# Patient Record
Sex: Male | Born: 1960 | Race: White | Hispanic: No | Smoking: Former smoker
Health system: Southern US, Community
[De-identification: ages and names within clinical notes are randomized; demographics above are authoritative.]

## PROBLEM LIST (undated history)

## (undated) DIAGNOSIS — K635 Polyp of colon: Secondary | ICD-10-CM

## (undated) DIAGNOSIS — E785 Hyperlipidemia, unspecified: Secondary | ICD-10-CM

## (undated) DIAGNOSIS — K760 Fatty (change of) liver, not elsewhere classified: Secondary | ICD-10-CM

## (undated) DIAGNOSIS — E559 Vitamin D deficiency, unspecified: Secondary | ICD-10-CM

## (undated) DIAGNOSIS — J309 Allergic rhinitis, unspecified: Secondary | ICD-10-CM

## (undated) DIAGNOSIS — E119 Type 2 diabetes mellitus without complications: Secondary | ICD-10-CM

## (undated) DIAGNOSIS — I10 Essential (primary) hypertension: Secondary | ICD-10-CM

## (undated) DIAGNOSIS — G629 Polyneuropathy, unspecified: Secondary | ICD-10-CM

## (undated) DIAGNOSIS — M549 Dorsalgia, unspecified: Secondary | ICD-10-CM

## (undated) DIAGNOSIS — K5792 Diverticulitis of intestine, part unspecified, without perforation or abscess without bleeding: Secondary | ICD-10-CM

## (undated) DIAGNOSIS — G4730 Sleep apnea, unspecified: Secondary | ICD-10-CM

## (undated) DIAGNOSIS — K219 Gastro-esophageal reflux disease without esophagitis: Secondary | ICD-10-CM

## (undated) HISTORY — DX: Dorsalgia, unspecified: M54.9

## (undated) HISTORY — PX: ESOPHAGOGASTRODUODENOSCOPY: SHX1529

## (undated) HISTORY — DX: Allergic rhinitis, unspecified: J30.9

## (undated) HISTORY — DX: Diverticulitis of intestine, part unspecified, without perforation or abscess without bleeding: K57.92

## (undated) HISTORY — PX: BACK SURGERY: SHX140

## (undated) HISTORY — DX: Fatty (change of) liver, not elsewhere classified: K76.0

## (undated) HISTORY — DX: Essential (primary) hypertension: I10

## (undated) HISTORY — DX: Sleep apnea, unspecified: G47.30

## (undated) HISTORY — DX: Hyperlipidemia, unspecified: E78.5

## (undated) HISTORY — DX: Vitamin D deficiency, unspecified: E55.9

## (undated) HISTORY — DX: Type 2 diabetes mellitus without complications: E11.9

## (undated) HISTORY — PX: ABDOMINAL HERNIA REPAIR: SHX539

## (undated) HISTORY — DX: Gastro-esophageal reflux disease without esophagitis: K21.9

## (undated) HISTORY — DX: Polyneuropathy, unspecified: G62.9

## (undated) HISTORY — PX: COLONOSCOPY: SHX174

## (undated) HISTORY — DX: Polyp of colon: K63.5

---

## 2005-10-30 HISTORY — PX: TOTAL HIP ARTHROPLASTY: SHX124

## 2021-04-14 ENCOUNTER — Other Ambulatory Visit: Payer: Self-pay

## 2021-04-14 ENCOUNTER — Encounter: Payer: Self-pay | Admitting: Pulmonary Disease

## 2021-04-14 ENCOUNTER — Ambulatory Visit (INDEPENDENT_AMBULATORY_CARE_PROVIDER_SITE_OTHER): Payer: Medicare Other | Admitting: Pulmonary Disease

## 2021-04-14 ENCOUNTER — Other Ambulatory Visit (HOSPITAL_COMMUNITY)
Admission: RE | Admit: 2021-04-14 | Discharge: 2021-04-14 | Disposition: A | Discharge status: Home / Self Care | Payer: Medicare Other | Source: Ambulatory Visit | Attending: Pulmonary Disease | Admitting: Pulmonary Disease

## 2021-04-14 VITALS — BP 110/62 | HR 65 | Temp 97.1°F | Ht 72.0 in | Wt 208.6 lb

## 2021-04-14 DIAGNOSIS — J449 Chronic obstructive pulmonary disease, unspecified: Secondary | ICD-10-CM | POA: Diagnosis present

## 2021-04-14 DIAGNOSIS — G4733 Obstructive sleep apnea (adult) (pediatric): Secondary | ICD-10-CM | POA: Diagnosis not present

## 2021-04-14 DIAGNOSIS — J9611 Chronic respiratory failure with hypoxia: Secondary | ICD-10-CM | POA: Diagnosis not present

## 2021-04-14 LAB — COMPREHENSIVE METABOLIC PANEL
ALT: 23 U/L (ref 0–44)
AST: 18 U/L (ref 15–41)
Albumin: 3.9 g/dL (ref 3.5–5.0)
Alkaline Phosphatase: 70 U/L (ref 38–126)
Anion gap: 7 (ref 5–15)
BUN: 12 mg/dL (ref 6–20)
CO2: 29 mmol/L (ref 22–32)
Calcium: 9.4 mg/dL (ref 8.9–10.3)
Chloride: 101 mmol/L (ref 98–111)
Creatinine, Ser: 0.76 mg/dL (ref 0.61–1.24)
GFR, Estimated: >60 mL/min (ref >60)
Glucose, Bld: 113 mg/dL — ABNORMAL HIGH (ref 70–99)
Potassium: 3.7 mmol/L (ref 3.5–5.1)
Sodium: 137 mmol/L (ref 135–145)
Total Bilirubin: 0.7 mg/dL (ref 0.3–1.2)
Total Protein: 7.2 g/dL (ref 6.5–8.1)

## 2021-04-14 LAB — CBC WITH DIFFERENTIAL/PLATELET
Abs Immature Granulocytes: 0.03 10*3/uL (ref 0.00–0.07)
Basophils Absolute: 0.1 10*3/uL (ref 0.0–0.1)
Basophils Relative: 1 %
Eosinophils Absolute: 0.1 10*3/uL (ref 0.0–0.5)
Eosinophils Relative: 1 %
HCT: 47.4 % (ref 39.0–52.0)
Hemoglobin: 15.6 g/dL (ref 13.0–17.0)
Immature Granulocytes: 0 %
Lymphocytes Relative: 25 %
Lymphs Abs: 2.6 10*3/uL (ref 0.7–4.0)
MCH: 30.1 pg (ref 26.0–34.0)
MCHC: 32.9 g/dL (ref 30.0–36.0)
MCV: 91.3 fL (ref 80.0–100.0)
Monocytes Absolute: 1.2 10*3/uL — ABNORMAL HIGH (ref 0.1–1.0)
Monocytes Relative: 12 %
Neutro Abs: 6.4 10*3/uL (ref 1.7–7.7)
Neutrophils Relative %: 61 %
Platelets: 331 10*3/uL (ref 150–400)
RBC: 5.19 MIL/uL (ref 4.22–5.81)
RDW: 13.8 % (ref 11.5–15.5)
WBC: 10.4 10*3/uL (ref 4.0–10.5)
nRBC: 0 % (ref 0.0–0.2)

## 2021-04-14 NOTE — Progress Notes (Signed)
[facility]  Chief Complaint  Patient presents with   Consult    Changing doctors from [place]--Currently CPAP & O2 HS    Constitutional:  BP 110/62 (BP Location: Left Arm, Cuff Size: Normal)   Pulse 65   Temp (!) 97.1 F (36.2 C) (Temporal)   Ht 6' (1.829 m)   Wt 208 lb 9.6 oz (94.6 kg)   SpO2 96% Comment: Room air  BMI 28.29 kg/m   Past Medical History:  Diverticulitis, Colon polyp, Fatty liver, GERD, Back pain, Gastroparesis, HTN, DM type 2, Neuropathy, Vit D deficiency  Past Surgical History:  He  has a past surgical history that includes Esophagogastroduodenoscopy; Colonoscopy; Back surgery; Total hip arthroplasty (Bilateral, 2007); and Abdominal hernia repair.  Brief Summary:  Jerry Glenn is a 60 y.o. male former smoker with COPD, obstructive sleep apnea, and chronic hypoxic respiratory failure.      Subjective:   He has been followed by [facility].  He has COPD, sleep apnea, mild bronchiectasis, lung nodule, and hypoxia.  He presents today for second opinion and transfer pulmonary/sleep care.    He is from [place].  Was in the [employer].  Disabled due to back issues.  Used to work at [employer].  Had pneumonia years ago.  No animal/bird exposures.  No history of TB.  His mother had emphysema.  He isn't sure if he was every checked for alpha 1 antitrypsin.  He keeps up with activity at steady pace.  He uses 2 liters oxygen sometimes with activity.  He is using CPAP with 2 liters oxygen at night.  No issues with mask fit.  Sleeping well.  Has occasional cough with clear sputum.  No wheeze, chest pain, hemoptysis, fever, weight loss, or leg swelling.  Occasional gets cramps.  Has trouble with allergies in Spring and Fall.    Physical Exam:   Appearance - well kempt   ENMT - no sinus tenderness, no oral exudate, no LAN, Mallampati 3 airway, no stridor  Respiratory - decreased breath sounds bilaterally, no  wheezing or rales  CV - s1s2 regular rate and rhythm, no murmurs  Ext - no clubbing, no edema  Skin - no rashes  Psych - normal mood and affect   Pulmonary testing:  PFT 10/12/17 >> FEV1 1.14 (32%), FEV1% 54, TLC 4.58 (64%), DLCO 30%  Chest Imaging:  LDCT chest 06/25/17 >> 2 mm nodule RLLL, mild cylindrical BTX, scar lingula/RML/RUL  Sleep Tests:  PSG 05/16/11 >> AHI 27.8, SpO2 low 75% CPAP titration 10/13/17 >> CPAP 11 cm H2O with 2 liters O2 CPAP 03/15/21 to 04/13/21 >> used on 30 of 30 nights with average 7 hrs 4 min.  Average AHI 0.2 with CPAP 11 cm H2O  Social History:  He  reports that he quit smoking about 2 years ago. His smoking use included cigarettes. He has a 40.00 pack-year smoking history. He has never used smokeless tobacco.  Family History:  His family history includes COPD in his mother; Cancer in an other family member.     Assessment/Plan:   COPD with emphysema and chronic bronchitis. - will arrange for repeat PFT and check of alpha 1 antitrypsin level - continue trelegy and prn albuterol  Seasonal allergic rhinitis. - continue singulair  Obstructive sleep apnea. - he is compliant with CPAP and reports benefit from therapy - continue CPAP 11 cm H2O with 2 liters oxygen at night  Regional bronchiectasis. - likely from prior respiratory infections -  bronchial hygiene  Lung nodule, history of tobacco abuse. - will arrange for referral to K[facility]ice Robinsons to have him enrolled in lung cancer screening program again  Chronic respiratory failure with hypoxia. - goal SpO2 > 90% - 2 liters oxygen at night with CPAP and prn during he day with exertion  Time Spent Involved in Patient Care on Day of Examination:  48 minutes  Follow up:   Patient Instructions  Lab tests today  Will arrange for pulmonary function test at [facility]  Will arrange for referral to [facility]ice Robinsons to talk about enrolling in lung cancer screening program  Follow up in 3  months  Medication List:   Allergies as of 04/14/2021   No Known Allergies      Medication List        Accurate as of April 14, 2021 10:42 AM. If you have any questions, ask your nurse or doctor.          albuterol 108 (90 Base) MCG/ACT inhaler Commonly known as: VENTOLIN HFA Inhale 2 puffs into the lungs every 6 (six) hours as needed for wheezing or shortness of breath.   amLODipine 5 MG tablet Commonly known as: NORVASC Take 5 mg by mouth daily.   aspirin 81 MG chewable tablet Chew 81 mg by mouth daily.   Farxiga 10 MG Tabs tablet Generic drug: dapagliflozin propanediol Take 10 mg by mouth daily.   Fish Oil 1000 MG Caps Take by mouth in the morning and at bedtime.   fluticasone 50 MCG/ACT nasal spray Commonly known as: FLONASE Place 1 spray into both nostrils daily.   furosemide 20 MG tablet Commonly known as: LASIX Take 20 mg by mouth daily as needed.   gabapentin 300 MG capsule Commonly known as: NEURONTIN Take 300 mg by mouth 2 (two) times daily.   HYDROcodone-acetaminophen 10-325 MG tablet Commonly known as: NORCO Take 1 tablet by mouth 3 (three) times daily as needed.   ipratropium 0.02 % nebulizer solution Commonly known as: ATROVENT Take 0.5 mg by nebulization every 6 (six) hours as needed for wheezing or shortness of breath.   lisinopril-hydrochlorothiazide 20-12.5 MG tablet Commonly known as: ZESTORETIC Take 2 tablets by mouth daily.   montelukast 10 MG tablet Commonly known as: SINGULAIR Take 10 mg by mouth at bedtime.   multivitamin tablet Take 1 tablet by mouth daily.   naproxen sodium 220 MG tablet Commonly known as: ALEVE Take 220 mg by mouth daily as needed.   ondansetron 4 MG tablet Commonly known as: ZOFRAN Take 4 mg by mouth 2 (two) times daily as needed for nausea or vomiting.   potassium chloride 10 MEQ tablet Commonly known as: KLOR-CON Take 10 mEq by mouth daily as needed.   RYBELSUS PO Take by mouth daily.    simvastatin 20 MG tablet Commonly known as: ZOCOR Take 20 mg by mouth daily.   Trelegy Ellipta 100-62.5-25 MCG/INH Aepb Generic drug: Fluticasone-Umeclidin-Vilant Inhale 1 puff into the lungs daily.   zolpidem 10 MG tablet Commonly known as: AMBIEN Take 10 mg by mouth at bedtime as needed for sleep.        Signature:  Coralyn Helling, MD [facility]/Critical Care Pager - [phone] 04/14/2021, 10:42 AM

## 2021-04-14 NOTE — Patient Instructions (Signed)
Lab tests today  Will arrange for pulmonary function test at [facility]  Will arrange for referral to [facility] Robinsons to talk about enrolling in lung cancer screening program  Follow up in 3 months

## 2021-04-15 ENCOUNTER — Telehealth: Payer: Self-pay | Admitting: Pulmonary Disease

## 2021-04-15 LAB — ALPHA-1-ANTITRYPSIN: A-1 Antitrypsin, Ser: 154 mg/dL (ref 101–187)

## 2021-04-15 NOTE — Telephone Encounter (Signed)
I do not see record of our office attempting to contact patient.

## 2021-04-15 NOTE — Telephone Encounter (Signed)
Called and spoke with patient who stated someone from the phone number [phone] called this morning but patient was unable to answer at the time and no voicemail was left per patient. When writer called the number it was to the Pulmonary division [facility] office. Patient unsure the reason for phone call unless it was for the lung cancer screening referral placed by Dr Craige Cotta yesterday (04/14/21). Patient states he is ok and nothing is needed at this time and he was just returning the call from the number listed. Will route to [facility] triage to see if someone from that office called.

## 2021-04-18 ENCOUNTER — Institutional Professional Consult (permissible substitution): Payer: Self-pay | Admitting: Internal Medicine

## 2021-04-18 ENCOUNTER — Other Ambulatory Visit: Payer: Self-pay | Admitting: *Deleted

## 2021-04-18 DIAGNOSIS — Z87891 Personal history of nicotine dependence: Secondary | ICD-10-CM

## 2021-05-25 ENCOUNTER — Other Ambulatory Visit: Payer: Self-pay

## 2021-05-25 ENCOUNTER — Ambulatory Visit (HOSPITAL_COMMUNITY)
Admission: RE | Admit: 2021-05-25 | Discharge: 2021-05-25 | Disposition: A | Discharge status: Home / Self Care | Payer: Medicare Other | Source: Ambulatory Visit | Attending: Acute Care | Admitting: Acute Care

## 2021-05-25 ENCOUNTER — Ambulatory Visit (INDEPENDENT_AMBULATORY_CARE_PROVIDER_SITE_OTHER): Payer: Medicare Other | Admitting: Acute Care

## 2021-05-25 ENCOUNTER — Encounter: Payer: Self-pay | Admitting: Acute Care

## 2021-05-25 DIAGNOSIS — Z87891 Personal history of nicotine dependence: Secondary | ICD-10-CM | POA: Diagnosis present

## 2021-05-25 NOTE — Progress Notes (Signed)
Virtual Visit via Telephone Note  I connected with Jerry Glenn on 05/25/21 at 12:00 PM EDT by telephone and verified that I am speaking with the correct person using two identifiers.  Location: Patient: At home Provider: [address] [place], [place], [address]   Pt. Has agreed to telephone visit. He understands the limitations of virtual visits, and is willing to proceed.    Shared Decision Making Visit Lung Cancer Screening Program [phone])   Eligibility: Age 60 y.o. Pack Years Smoking History Calculation 41 pack year smoking history (# packs/per year x # years smoked) Recent History of coughing up blood  no Unexplained weight loss? no ( >Than 15 pounds within the last 6 months ) Prior History Lung / other cancer no (Diagnosis within the last 5 years already requiring surveillance chest CT Scans). Smoking Status Former Smoker Former Smokers: Years since quit: 2 years  Quit Date: 2020  Visit Components: Discussion included one or more decision making aids. yes Discussion included risk/benefits of screening. yes Discussion included potential follow up diagnostic testing for abnormal scans. yes Discussion included meaning and risk of over diagnosis. yes Discussion included meaning and risk of False Positives. yes Discussion included meaning of total radiation exposure. yes  Counseling Included: Importance of adherence to annual lung cancer LDCT screening. yes Impact of comorbidities on ability to participate in the program. yes Ability and willingness to under diagnostic treatment. yes  Smoking Cessation Counseling: Current Smokers:  Discussed importance of smoking cessation. yes Information about tobacco cessation classes and interventions provided to patient. yes Patient provided with "ticket" for LDCT Scan. yes Symptomatic Patient. no  Counseling Diagnosis Code: Tobacco Use Z72.0 Asymptomatic Patient yes  Counseling (Intermediate counseling: > three minutes  counseling) N4627 Former Smokers:  Discussed the importance of maintaining cigarette abstinence. yes Diagnosis Code: Personal History of Nicotine Dependence. O35.009 Information about tobacco cessation classes and interventions provided to patient. Yes Patient provided with "ticket" for LDCT Scan. yes Written Order for Lung Cancer Screening with LDCT placed in Epic. Yes (CT Chest Lung Cancer Screening Low Dose W/O CM) [id number] Z12.2-Screening of respiratory organs Z87.891-Personal history of nicotine dependence  I spent 25 minutes of face to face time with Jerry Glenn discussing the risks and benefits of lung cancer screening. We viewed a power point together that explained in detail the above noted topics. We took the time to pause the power point at intervals to allow for questions to be asked and answered to ensure understanding. We discussed that he had taken the single most powerful action possible to decrease his risk of developing lung cancer when he quit smoking. I counseled him to remain smoke free, and to contact me if he ever had the desire to smoke again so that I can provide resources and tools to help support the effort to remain smoke free. We discussed the time and location of the scan, and that either  Abigail Miyamoto RN or I will call with the results within  24-48 hours of receiving them. He has my card and contact information in the event he needs to speak with me, in addition to a copy of the power point we reviewed as a resource. He verbalized understanding of all of the above and had no further questions upon leaving the office.     I explained to the patient that there has been a high incidence of coronary artery disease noted on these exams. I explained that this is a non-gated exam therefore degree or  severity cannot be determined. This patient is on statin therapy. I have asked the patient to follow-up with their PCP regarding any incidental finding of coronary artery disease  and management with diet or medication as they feel is clinically indicated. The patient verbalized understanding of the above and had no further questions.     Bevelyn Ngo, NP 05/25/2021

## 2021-05-25 NOTE — Patient Instructions (Signed)
Thank you for participating in the [facility] Screening Program. It was our pleasure to meet you today. We will call you with the results of your scan within the next few days. Your scan will be assigned a Lung RADS category score by the physicians reading the scans.  This Lung RADS score determines follow up scanning.  See below for description of categories, and follow up screening recommendations. We will be in touch to schedule your follow up screening annually or based on recommendations of our providers. We will fax a copy of your scan results to your Primary Care Physician, or the physician who referred you to the program, to ensure they have the results. Please call the office if you have any questions or concerns regarding your scanning experience or results.  Our office number is [phone]. Please speak with Denise Phelps, RN. She is our Lung Cancer Screening RN. If she is unavailable when you call, please have the office staff send her a message. She will return your call at her earliest convenience. Remember, if your scan is normal, we will scan you annually as long as you continue to meet the criteria for the program. (Age 55-77, Current smoker or smoker who has quit within the last 15 years). If you are a smoker, remember, quitting is the single most powerful action that you can take to decrease your risk of lung cancer and other pulmonary, breathing related problems. We know quitting is hard, and we are here to help.  Please let us know if there is anything we can do to help you meet your goal of quitting. If you are a former smoker, congratulations. We are proud of you! Remain smoke free! Remember you can refer friends or family members through the number above.  We will screen them to make sure they meet criteria for the program. Thank you for helping us take better care of you by participating in Lung Screening.  Lung RADS Categories:  Lung RADS 1: no nodules  or definitely non-concerning nodules.  Recommendation is for a repeat annual scan in 12 months.  Lung RADS 2:  nodules that are non-concerning in appearance and behavior with a very low likelihood of becoming an active cancer. Recommendation is for a repeat annual scan in 12 months.  Lung RADS 3: nodules that are probably non-concerning , includes nodules with a low likelihood of becoming an active cancer.  Recommendation is for a 6-month repeat screening scan. Often noted after an upper respiratory illness. We will be in touch to make sure you have no questions, and to schedule your 6-month scan.  Lung RADS 4 A: nodules with concerning findings, recommendation is most often for a follow up scan in 3 months or additional testing based on our provider's assessment of the scan. We will be in touch to make sure you have no questions and to schedule the recommended 3 month follow up scan.  Lung RADS 4 B:  indicates findings that are concerning. We will be in touch with you to schedule additional diagnostic testing based on our provider's  assessment of the scan.   

## 2021-06-09 NOTE — Progress Notes (Signed)
I have called the patient with the results of his low dose CT. I explained that his scan was read as a lung RADS 3.  He states that when he had the scan done he was having a COPD flare, and feels this may be related to that.  I explained that to be cautious we will recheck this area on a repeat CT scan in January 2023.  This is the 55-month follow-up CT recommended by radiology. I also explained that there was notation of aortic atherosclerosis in addition to left main and three-vessel coronary artery disease.  The patient is currently on statin therapy, and he is followed by cardiology once annually.  We discussed that he does have a family history, his father passed away at the age of 3 or 57 of a massive heart attack. He will follow-up with cardiology. Denise please fax results to PCP, let them know we will follow-up with a 35-month CT in January 2023, and please place order for 63-month follow-up low-dose CT. Thanks so much

## 2021-06-10 ENCOUNTER — Telehealth: Payer: Self-pay | Admitting: Acute Care

## 2021-06-10 NOTE — Telephone Encounter (Signed)
Bevelyn Ngo, NP  Valeda Malm A; [facility] Triage Pool 26 minutes ago (10:54 AM)   Please have patient sign a release and have CT  copied onto CD for patient to pick up. Thanks so much     Called and spoke with pt stating to him to go to [facility] where he had the CT performed and they should be able to give him a disc of the scan. Stated to him that he might have to fill out a release and he verbalized understanding. Nothing further needed.

## 2021-06-10 NOTE — Telephone Encounter (Signed)
Pt is wanting to know if he can get a copy of his CT results possibly on a disk so that he can have record of it to give to his cardiologist. Pls regard; [phone].

## 2021-06-13 ENCOUNTER — Other Ambulatory Visit: Payer: Self-pay | Admitting: *Deleted

## 2021-06-13 DIAGNOSIS — Z87891 Personal history of nicotine dependence: Secondary | ICD-10-CM

## 2021-06-24 ENCOUNTER — Ambulatory Visit (HOSPITAL_COMMUNITY)
Admission: RE | Admit: 2021-06-24 | Discharge: 2021-06-24 | Disposition: A | Discharge status: Home / Self Care | Payer: Medicare Other | Source: Ambulatory Visit | Attending: Pulmonary Disease | Admitting: Pulmonary Disease

## 2021-06-24 ENCOUNTER — Other Ambulatory Visit: Payer: Self-pay

## 2021-06-24 DIAGNOSIS — J449 Chronic obstructive pulmonary disease, unspecified: Secondary | ICD-10-CM | POA: Insufficient documentation

## 2021-06-24 LAB — PULMONARY FUNCTION TEST
DL/VA % pred: 62 %
DL/VA: 2.64 ml/min/mmHg/L
DLCO unc % pred: 43 %
DLCO unc: 13 ml/min/mmHg
FEF 25-75 Post: 0.43 L/sec
FEF 25-75 Pre: 0.32 L/sec
FEF2575-%Change-Post: 34 %
FEF2575-%Pred-Post: 13 %
FEF2575-%Pred-Pre: 9 %
FEV1-%Change-Post: 12 %
FEV1-%Pred-Post: 29 %
FEV1-%Pred-Pre: 26 %
FEV1-Post: 1.15 L
FEV1-Pre: 1.02 L
FEV1FVC-%Change-Post: 9 %
FEV1FVC-%Pred-Pre: 49 %
FEV6-%Change-Post: 6 %
FEV6-%Pred-Post: 48 %
FEV6-%Pred-Pre: 45 %
FEV6-Post: 2.39 L
FEV6-Pre: 2.24 L
FEV6FVC-%Change-Post: 3 %
FEV6FVC-%Pred-Post: 89 %
FEV6FVC-%Pred-Pre: 86 %
FVC-%Change-Post: 3 %
FVC-%Pred-Post: 54 %
FVC-%Pred-Pre: 53 %
FVC-Post: 2.81 L
FVC-Pre: 2.73 L
Post FEV1/FVC ratio: 41 %
Post FEV6/FVC ratio: 85 %
Pre FEV1/FVC ratio: 37 %
Pre FEV6/FVC Ratio: 82 %
RV % pred: 189 %
RV: 4.42 L
TLC % pred: 100 %
TLC: 7.45 L

## 2021-06-24 MED ORDER — ALBUTEROL SULFATE (2.5 MG/3ML) 0.083% IN NEBU
2.5000 mg | INHALATION_SOLUTION | Freq: Once | RESPIRATORY_TRACT | Status: AC
  Administered 2021-06-24: 2.5 mg via RESPIRATORY_TRACT

## 2021-07-15 ENCOUNTER — Encounter: Payer: Self-pay | Admitting: Pulmonary Disease

## 2021-07-15 ENCOUNTER — Other Ambulatory Visit: Payer: Self-pay

## 2021-07-15 ENCOUNTER — Ambulatory Visit (INDEPENDENT_AMBULATORY_CARE_PROVIDER_SITE_OTHER): Payer: Medicare Other | Admitting: Pulmonary Disease

## 2021-07-15 VITALS — BP 112/58 | HR 89 | Temp 98.5°F | Ht 72.0 in | Wt 203.0 lb

## 2021-07-15 DIAGNOSIS — G4733 Obstructive sleep apnea (adult) (pediatric): Secondary | ICD-10-CM | POA: Diagnosis not present

## 2021-07-15 DIAGNOSIS — J9611 Chronic respiratory failure with hypoxia: Secondary | ICD-10-CM | POA: Diagnosis not present

## 2021-07-15 DIAGNOSIS — J449 Chronic obstructive pulmonary disease, unspecified: Secondary | ICD-10-CM | POA: Diagnosis not present

## 2021-07-15 MED ORDER — DOXYCYCLINE HYCLATE 100 MG PO TABS: 100.0000 mg | ORAL_TABLET | Freq: Two times a day (BID) | ORAL | 0 refills | Status: DC

## 2021-07-15 MED ORDER — PREDNISONE 10 MG PO TABS: ORAL_TABLET | ORAL | 0 refills | Status: AC

## 2021-07-15 MED ORDER — BREZTRI AEROSPHERE 160-9-4.8 MCG/ACT IN AERO: 2.0000 | INHALATION_SPRAY | Freq: Two times a day (BID) | RESPIRATORY_TRACT | 0 refills | Status: DC

## 2021-07-15 NOTE — Patient Instructions (Signed)
Try sample of breztri two puffs in the morning and two puffs in the evening, and rinse your mouth after each use.  Call after sample is complete if you would like refills for this.  Don't use trelegy while using breztri.  Follow up in 4 months.

## 2021-07-15 NOTE — Addendum Note (Signed)
Addended by: Carleene Mains D on: 07/15/2021 10:33 AM   Modules accepted: Orders

## 2021-07-15 NOTE — Progress Notes (Signed)
[facility]  Chief Complaint  Patient presents with   Follow-up    Coughing up mucus yellow and green for a couple of weeks no SOB to report     Constitutional:  BP (!) 112/58 (BP Location: Left Arm, Patient Position: Sitting)   Pulse 89   Temp 98.5 F (36.9 C) (Temporal)   Ht 6' (1.829 m)   Wt 203 lb (92.1 kg)   SpO2 95%   BMI 27.53 kg/m   Past Medical History:  Diverticulitis, Colon polyp, Fatty liver, GERD, Back pain, Gastroparesis, HTN, DM type 2, Neuropathy, Vit D deficiency  Past Surgical History:  He  has a past surgical history that includes Esophagogastroduodenoscopy; Colonoscopy; Back surgery; Total hip arthroplasty (Bilateral, 2007); and Abdominal hernia repair.  Brief Summary:  Jerry Glenn is a 60 y.o. male former smoker with COPD, obstructive sleep apnea, and chronic hypoxic respiratory failure.      Subjective:   PFT showed severe obstruction, air trapping, moderate diffusion defect, and positive bronchodilator response.  LDCT showed lung nodule in RUL.  A1AT level normal.  He feels his cough and congestion are coming on again with weather change.  He usually needs prednisone and ABx when this happens.    Singulair has helped.  Wants to try breztri to see if this works better than trelegy.  Uses albuterol few times per week.  Uses CPAP nightly with oxygen.  Uses oxygen intermittently during the day.  He has pulse ox meter.  He plans to get flu and COVID vaccines in October.   Physical Exam:   Appearance - well kempt   ENMT - no sinus tenderness, no oral exudate, no LAN, Mallampati 3 airway, no stridor  Respiratory - decreased breath sounds bilaterally, no wheezing or rales  CV - s1s2 regular rate and rhythm, no murmurs  Ext - no clubbing, no edema  Skin - no rashes  Psych - normal mood and affect    Pulmonary testing:  PFT 10/12/17 >> FEV1 1.14 (32%), FEV1% 54, TLC 4.58 (64%), DLCO 30% A1AT  04/14/21 >> 154 PFT 06/24/21 >> FEV1 1.15 (29%), FEV1% 41, TLC 7.45 (100%), RV 4.42 (189%), DLCO 43%, +BD  Chest Imaging:  LDCT chest 06/25/17 >> 2 mm nodule RLLL, mild cylindrical BTX, scar lingula/RML/RUL LDCT chest 05/27/21 >> nodular area of architectural distortion in the medial aspect of the RUL 7.9 mm, bronchial wall thickening, mild centrilobular and paraseptal emphysema  Sleep Tests:  PSG 05/16/11 >> AHI 27.8, SpO2 low 75% CPAP titration 10/13/17 >> CPAP 11 cm H2O with 2 liters O2 CPAP 06/14/21 to 07/13/21 >> used on 30 of 30 nights with average 5 hrs 50 min.  Average AHI 0.1 with CPAP 11 cm H2O  Social History:  He  reports that he quit smoking about 2 years ago. His smoking use included cigarettes. He has a 40.00 pack-year smoking history. He has never used smokeless tobacco.  Family History:  His family history includes COPD in his mother; Cancer in an other family member.     Assessment/Plan:   COPD with emphysema and chronic bronchitis. - sample of breztri given; he will call once sample done to let us know if he would like to continue breztri or switch back to trelegy - prn albuterol - script for prednisone and doxycycline provided for him to keep on hand >> he is to call to update office he feels he needs to use these  Seasonal allergic rhinitis. - continue  singulair  Obstructive sleep apnea. - he is compliant with CPAP and reports benefit from therapy - he uses [facility] for his DME - continue CPAP 11 cm H2O with 2 liters oxygen at night  Regional bronchiectasis. - likely from prior respiratory infections - bronchial hygiene  Lung nodule, history of tobacco abuse. - he has f/u LDCT chest scheduled for January 2023  Chronic respiratory failure with hypoxia. - goal SpO2 > 90% - 2 liters oxygen at night with CPAP and prn during he day with exertion  Time Spent Involved in Patient Care on Day of Examination:  35 minutes  Follow up:   Patient Instructions  Try  sample of breztri two puffs in the morning and two puffs in the evening, and rinse your mouth after each use.  Call after sample is complete if you would like refills for this.  Don't use trelegy while using breztri.  Follow up in 4 months.  Medication List:   Allergies as of 07/15/2021   No Known Allergies      Medication List        Accurate as of July 15, 2021 10:14 AM. If you have any questions, ask your nurse or doctor.          STOP taking these medications    amLODipine 5 MG tablet Commonly known as: NORVASC Stopped by: Coralyn Helling, MD       TAKE these medications    albuterol 108 (90 Base) MCG/ACT inhaler Commonly known as: VENTOLIN HFA Inhale 2 puffs into the lungs every 6 (six) hours as needed for wheezing or shortness of breath.   aspirin 81 MG chewable tablet Chew 81 mg by mouth daily.   dapagliflozin propanediol 10 MG Tabs tablet Commonly known as: FARXIGA Take 10 mg by mouth daily.   doxycycline 100 MG tablet Commonly known as: VIBRA-TABS Take 1 tablet (100 mg total) by mouth 2 (two) times daily. Started by: Coralyn Helling, MD   Fish Oil 1000 MG Caps Take by mouth in the morning and at bedtime.   fluticasone 50 MCG/ACT nasal spray Commonly known as: FLONASE Place 1 spray into both nostrils daily.   furosemide 20 MG tablet Commonly known as: LASIX Take 20 mg by mouth daily as needed.   gabapentin 300 MG capsule Commonly known as: NEURONTIN Take 300 mg by mouth 2 (two) times daily.   HYDROcodone-acetaminophen 10-325 MG tablet Commonly known as: NORCO Take 1 tablet by mouth 3 (three) times daily as needed.   ipratropium 0.02 % nebulizer solution Commonly known as: ATROVENT Take 0.5 mg by nebulization every 6 (six) hours as needed for wheezing or shortness of breath.   lisinopril-hydrochlorothiazide 20-12.5 MG tablet Commonly known as: ZESTORETIC Take 2 tablets by mouth daily.   loratadine 10 MG tablet Commonly known as:  CLARITIN Take 10 mg by mouth daily.   montelukast 10 MG tablet Commonly known as: SINGULAIR Take 10 mg by mouth at bedtime.   multivitamin tablet Take 1 tablet by mouth daily.   naproxen sodium 220 MG tablet Commonly known as: ALEVE Take 220 mg by mouth daily as needed.   ondansetron 4 MG tablet Commonly known as: ZOFRAN Take 4 mg by mouth 2 (two) times daily as needed for nausea or vomiting.   potassium chloride 10 MEQ tablet Commonly known as: KLOR-CON Take 10 mEq by mouth daily as needed.   predniSONE 10 MG tablet Commonly known as: DELTASONE Take 3 tablets (30 mg total) by mouth daily with breakfast for  2 days, THEN 2 tablets (20 mg total) daily with breakfast for 2 days, THEN 1 tablet (10 mg total) daily with breakfast for 2 days. Start taking on: July 15, 2021 Started by: Coralyn Helling, MD   RYBELSUS PO Take by mouth daily.   simvastatin 20 MG tablet Commonly known as: ZOCOR Take 20 mg by mouth daily.   Trelegy Ellipta 100-62.5-25 MCG/INH Aepb Generic drug: Fluticasone-Umeclidin-Vilant Inhale 1 puff into the lungs daily.   zolpidem 10 MG tablet Commonly known as: AMBIEN Take 10 mg by mouth at bedtime as needed for sleep.        Signature:  Coralyn Helling, MD [facility] Pulmonary/Critical Care Pager - [phone] 07/15/2021, 10:14 AM

## 2021-07-28 ENCOUNTER — Other Ambulatory Visit: Payer: Self-pay

## 2021-07-28 ENCOUNTER — Telehealth: Payer: Self-pay | Admitting: Pulmonary Disease

## 2021-07-28 MED ORDER — BREZTRI AEROSPHERE 160-9-4.8 MCG/ACT IN AERO
2.0000 | INHALATION_SPRAY | Freq: Two times a day (BID) | RESPIRATORY_TRACT | 11 refills | Status: DC
Start: 2021-07-28 — End: 2022-07-04

## 2021-07-28 NOTE — Telephone Encounter (Signed)
Breztri sent to [facility] on [address]. Called patient and notified him. Nothing further needed at this time.

## 2021-11-22 ENCOUNTER — Ambulatory Visit: Payer: Medicare Other | Admitting: Pulmonary Disease

## 2021-12-20 ENCOUNTER — Other Ambulatory Visit: Payer: Self-pay

## 2021-12-20 ENCOUNTER — Encounter: Payer: Self-pay | Admitting: Pulmonary Disease

## 2021-12-20 ENCOUNTER — Ambulatory Visit (INDEPENDENT_AMBULATORY_CARE_PROVIDER_SITE_OTHER): Payer: 59 | Admitting: Pulmonary Disease

## 2021-12-20 ENCOUNTER — Telehealth: Payer: Self-pay | Admitting: Acute Care

## 2021-12-20 VITALS — BP 106/62 | HR 71 | Temp 98.1°F | Ht 72.0 in | Wt 195.8 lb

## 2021-12-20 DIAGNOSIS — J449 Chronic obstructive pulmonary disease, unspecified: Secondary | ICD-10-CM

## 2021-12-20 DIAGNOSIS — J9611 Chronic respiratory failure with hypoxia: Secondary | ICD-10-CM | POA: Diagnosis not present

## 2021-12-20 DIAGNOSIS — G4733 Obstructive sleep apnea (adult) (pediatric): Secondary | ICD-10-CM

## 2021-12-20 NOTE — Patient Instructions (Signed)
Will make sure your lung cancer screening CT chest will be set up in [facility]  Follow up in 6 months

## 2021-12-20 NOTE — Telephone Encounter (Signed)
Left message for pt to call back to schedule f/u lung screening CT scan.  ?

## 2021-12-20 NOTE — Progress Notes (Signed)
[facility]  Chief Complaint  Patient presents with   Follow-up    He reprots he feels like he is doing well with his CPAP.     Constitutional:  BP 106/62 (BP Location: Left Arm, Patient Position: Sitting, Cuff Size: Normal)    Pulse 71    Temp 98.1 F (36.7 C) (Oral)    Ht 6' (1.829 m)    Wt 195 lb 12.8 oz (88.8 kg)    SpO2 94%    BMI 26.56 kg/m   Past Medical History:  Diverticulitis, Colon polyp, Fatty liver, GERD, Back pain, Gastroparesis, HTN, DM type 2, Neuropathy, Vit D deficiency  Past Surgical History:  He  has a past surgical history that includes Esophagogastroduodenoscopy; Colonoscopy; Back surgery; Total hip arthroplasty (Bilateral, 2007); and Abdominal hernia repair.  Brief Summary:  Jerry Glenn is a 61 y.o. male former smoker with COPD, obstructive sleep apnea, and chronic hypoxic respiratory failure.      Subjective:   Breathing okay.  Uses albuterol few times per week.  Not having cough or wheeze unless there is big change in the weather.  Sinuses okay.  Using CPAP nightly.  No issues with mask fit.     Physical Exam:   Appearance - well kempt   ENMT - no sinus tenderness, no oral exudate, no LAN, Mallampati 3 airway, no stridor  Respiratory - equal breath sounds bilaterally, no wheezing or rales  CV - s1s2 regular rate and rhythm, no murmurs  Ext - no clubbing, no edema  Skin - no rashes  Psych - normal mood and affect     Pulmonary testing:  PFT 10/12/17 >> FEV1 1.14 (32%), FEV1% 54, TLC 4.58 (64%), DLCO 30% A1AT 04/14/21 >> 154 PFT 06/24/21 >> FEV1 1.15 (29%), FEV1% 41, TLC 7.45 (100%), RV 4.42 (189%), DLCO 43%, +BD  Chest Imaging:  LDCT chest 06/25/17 >> 2 mm nodule RLLL, mild cylindrical BTX, scar lingula/RML/RUL LDCT chest 05/27/21 >> nodular area of architectural distortion in the medial aspect of the RUL 7.9 mm, bronchial wall thickening, mild centrilobular and paraseptal emphysema  Sleep  Tests:  PSG 05/16/11 >> AHI 27.8, SpO2 low 75% CPAP titration 10/13/17 >> CPAP 11 cm H2O with 2 liters O2 CPAP 11/19/21 to 12/18/21 >> used on 28 of 30 nights with average 5 hrs 57 min.  Average AHI 0.3 with CPAP 11 cm H2O  Social History:  He  reports that he quit smoking about 3 years ago. His smoking use included cigarettes. He has a 40.00 pack-year smoking history. He has never used smokeless tobacco.  Family History:  His family history includes COPD in his mother; Cancer in an other family member.     Assessment/Plan:   COPD with emphysema and chronic bronchitis. - continue breztri bid - prn albuterol - he keeps scripts for prednisone and doxycycline on hand >> he is to call to update office he feels he needs to use these  Seasonal allergic rhinitis. - continue singulair, flonase  Obstructive sleep apnea. - he is compliant with CPAP and reports benefit from therapy - he uses [facility] for his DME - continue CPAP 11 cm H2O with 2 liters oxygen at night  Regional bronchiectasis. - likely from prior respiratory infections - bronchial hygiene  Lung nodule, history of tobacco abuse. - he will get low dose CT chest at [facility] in March 2023  Chronic respiratory failure with hypoxia. - goal SpO2 > 90% - 2 liters  oxygen at night with CPAP and prn during he day with exertion  Time Spent Involved in Patient Care on Day of Examination:  36 minutes  Follow up:   Patient Instructions  Will make sure your lung cancer screening CT chest will be set up in [facility]  Follow up in 6 months  Medication List:   Allergies as of 12/20/2021   No Known Allergies      Medication List        Accurate as of December 20, 2021 12:18 PM. If you have any questions, ask your nurse or doctor.          STOP taking these medications    doxycycline 100 MG tablet Commonly known as: VIBRA-TABS Stopped by: Coralyn Helling, MD   Trelegy Ellipta 100-62.5-25 MCG/ACT  Aepb Generic drug: Fluticasone-Umeclidin-Vilant Stopped by: Coralyn Helling, MD       TAKE these medications    albuterol 108 (90 Base) MCG/ACT inhaler Commonly known as: VENTOLIN HFA Inhale 2 puffs into the lungs every 6 (six) hours as needed for wheezing or shortness of breath.   aspirin 81 MG chewable tablet Chew 81 mg by mouth daily.   Breztri Aerosphere 160-9-4.8 MCG/ACT Aero Generic drug: Budeson-Glycopyrrol-Formoterol Inhale 2 puffs into the lungs in the morning and at bedtime. What changed: Another medication with the same name was removed. Continue taking this medication, and follow the directions you see here. Changed by: Coralyn Helling, MD   dapagliflozin propanediol 10 MG Tabs tablet Commonly known as: FARXIGA Take 10 mg by mouth daily.   Fish Oil 1000 MG Caps Take by mouth in the morning and at bedtime.   fluticasone 50 MCG/ACT nasal spray Commonly known as: FLONASE Place 1 spray into both nostrils daily.   furosemide 20 MG tablet Commonly known as: LASIX Take 20 mg by mouth daily as needed.   gabapentin 300 MG capsule Commonly known as: NEURONTIN Take 300 mg by mouth 2 (two) times daily.   HYDROcodone-acetaminophen 10-325 MG tablet Commonly known as: NORCO Take 1 tablet by mouth 3 (three) times daily as needed.   ipratropium 0.02 % nebulizer solution Commonly known as: ATROVENT Take 0.5 mg by nebulization every 6 (six) hours as needed for wheezing or shortness of breath.   lisinopril-hydrochlorothiazide 20-12.5 MG tablet Commonly known as: ZESTORETIC Take 2 tablets by mouth daily.   loratadine 10 MG tablet Commonly known as: CLARITIN Take 10 mg by mouth daily.   montelukast 10 MG tablet Commonly known as: SINGULAIR Take 10 mg by mouth at bedtime.   multivitamin tablet Take 1 tablet by mouth daily.   naproxen sodium 220 MG tablet Commonly known as: ALEVE Take 220 mg by mouth daily as needed.   ondansetron 4 MG tablet Commonly known as:  ZOFRAN Take 4 mg by mouth 2 (two) times daily as needed for nausea or vomiting.   potassium chloride 10 MEQ tablet Commonly known as: KLOR-CON M Take 10 mEq by mouth daily as needed.   RYBELSUS PO Take by mouth daily.   simvastatin 20 MG tablet Commonly known as: ZOCOR Take 20 mg by mouth daily.   zolpidem 10 MG tablet Commonly known as: AMBIEN Take 10 mg by mouth at bedtime as needed for sleep.        Signature:  Coralyn Helling, MD [facility] Pulmonary/Critical Care Pager - [phone] 12/20/2021, 12:18 PM

## 2022-01-03 ENCOUNTER — Other Ambulatory Visit: Payer: Self-pay

## 2022-01-03 ENCOUNTER — Ambulatory Visit (HOSPITAL_COMMUNITY)
Admission: RE | Admit: 2022-01-03 | Discharge: 2022-01-03 | Disposition: A | Discharge status: Home / Self Care | Payer: 59 | Source: Ambulatory Visit | Attending: Acute Care | Admitting: Acute Care

## 2022-01-03 DIAGNOSIS — Z87891 Personal history of nicotine dependence: Secondary | ICD-10-CM | POA: Insufficient documentation

## 2022-01-04 ENCOUNTER — Other Ambulatory Visit: Payer: Self-pay | Admitting: Acute Care

## 2022-01-04 DIAGNOSIS — Z87891 Personal history of nicotine dependence: Secondary | ICD-10-CM

## 2022-06-20 IMAGING — CT CT CHEST LCS NODULE FOLLOW-UP W/O CM
2 of 4 series · 14 of 36 positions shown, 17 images · non-contrast
Comparison: Chest CT 05/25/2021.

CLINICAL DATA: 61-year-old male former smoker (quit 3 years ago)
with 41 pack-year history of smoking. Follow-up for prior abnormal
low-dose lung cancer screening examination.

EXAM:
CT CHEST WITHOUT CONTRAST FOR LUNG CANCER SCREENING NODULE FOLLOW-UP
TECHNIQUE: Multidetector CT imaging of the chest was performed following the
standard protocol without IV contrast.
RADIATION DOSE REDUCTION: This exam was performed according to the
departmental dose-optimization program which includes automated
exposure control, adjustment of the mA and/or kV according to
patient size and/or use of iterative reconstruction technique.

[Series 4: lungs · axial · 0.75mm/px · z∈[+1282,+1604]mm · 11 of 181 slices shown, 14 images]
[im 10/181  mediastinal]
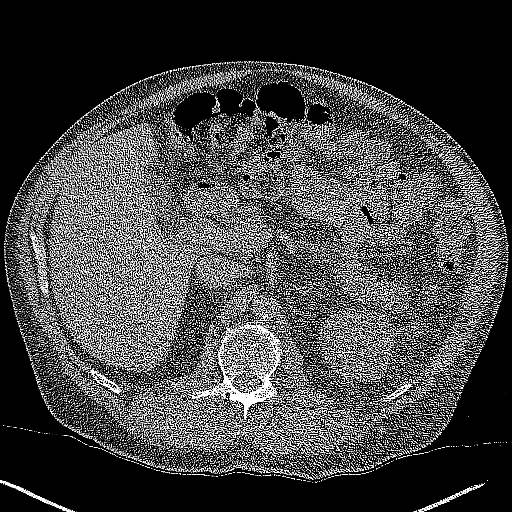
[im 10/181  lung]
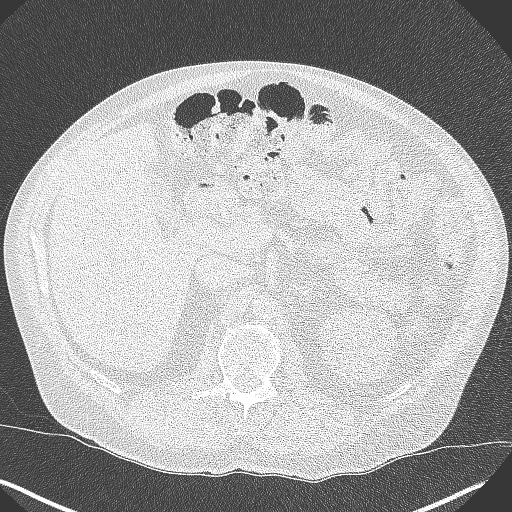
[im 29/181  lung]
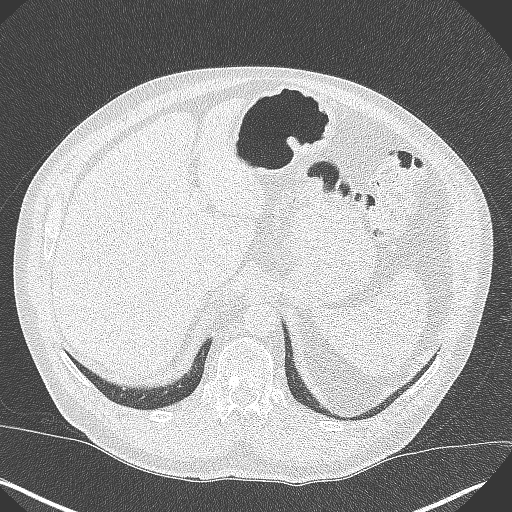
[im 48/181  lung]
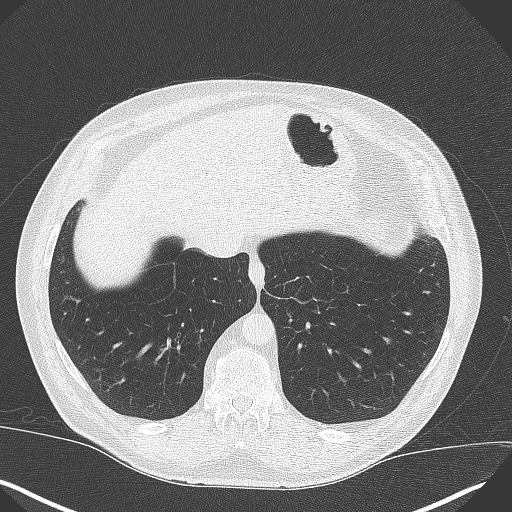
[im 57/181  lung]
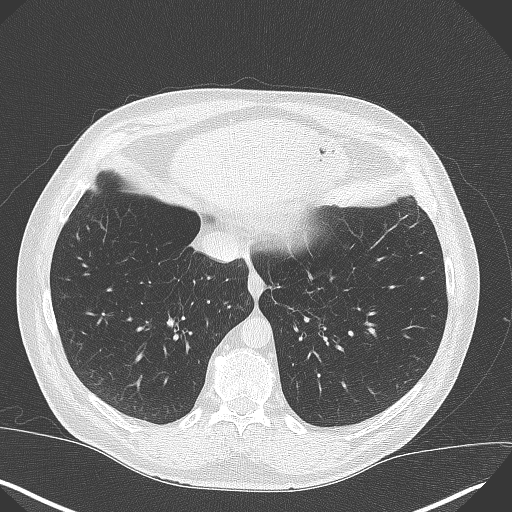
[im 76/181  mediastinal]
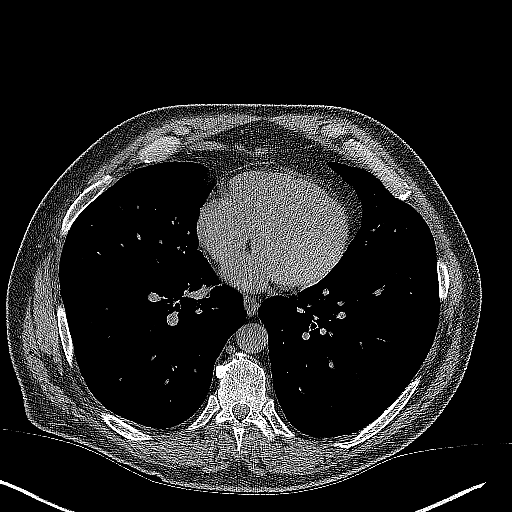
[im 76/181  lung]
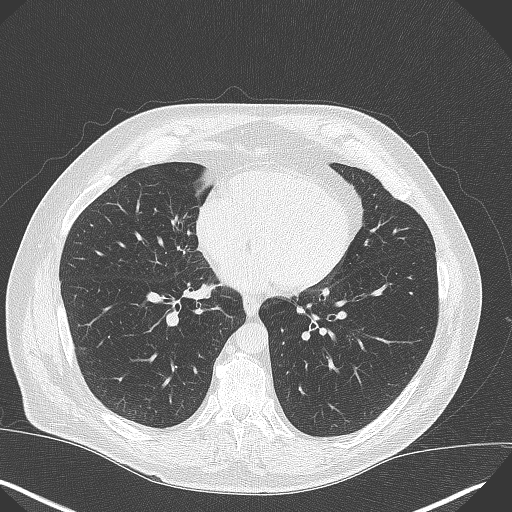
[im 95/181  lung]
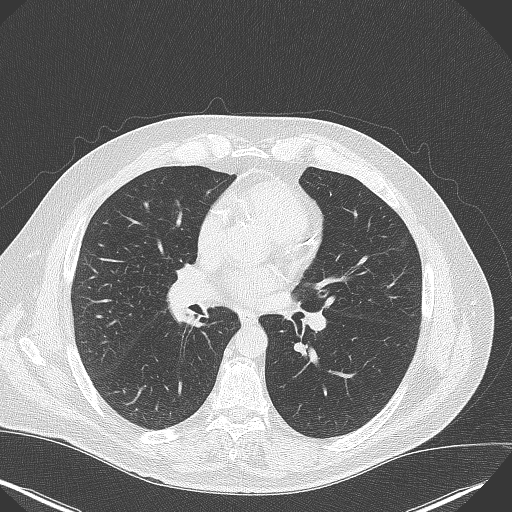
[im 105/181  lung]
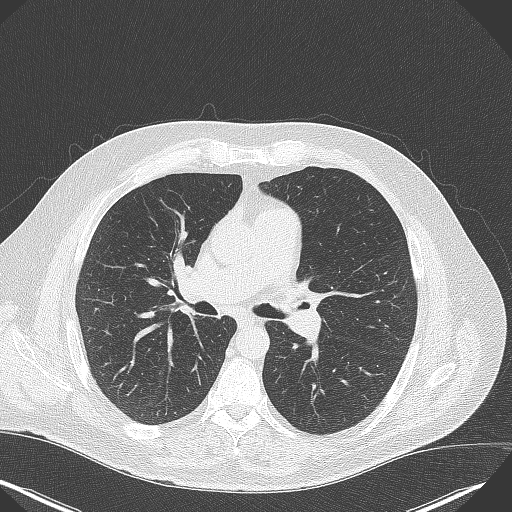
[im 124/181  lung]
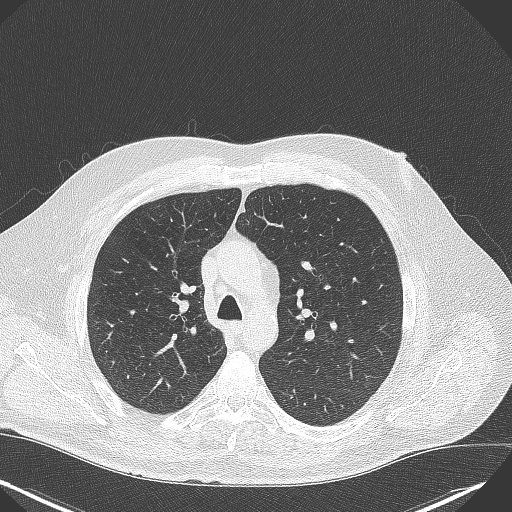
[im 133/181  mediastinal]
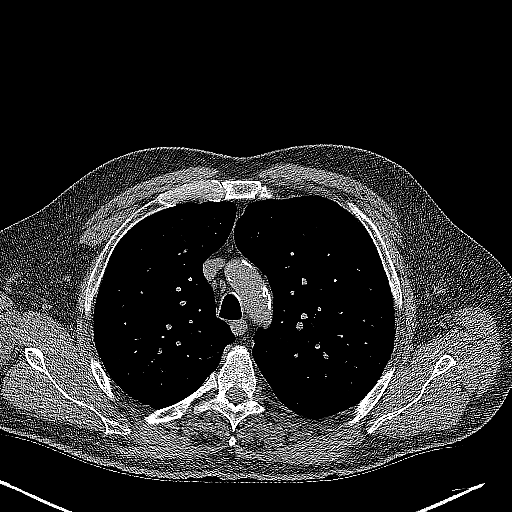
[im 133/181  lung]
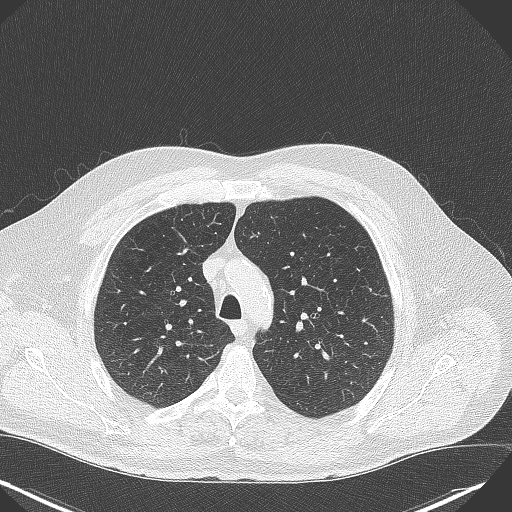
[im 152/181  lung]
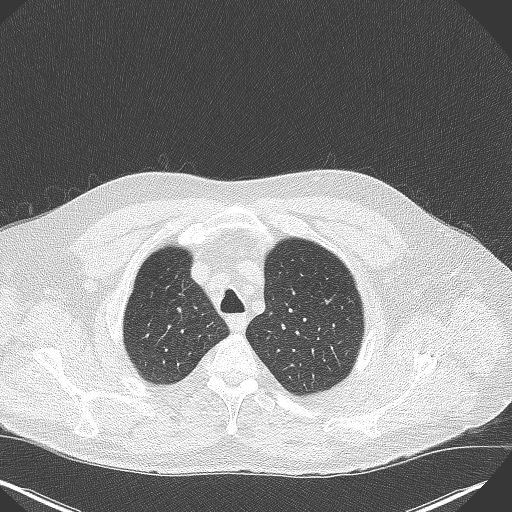
[im 171/181  lung]
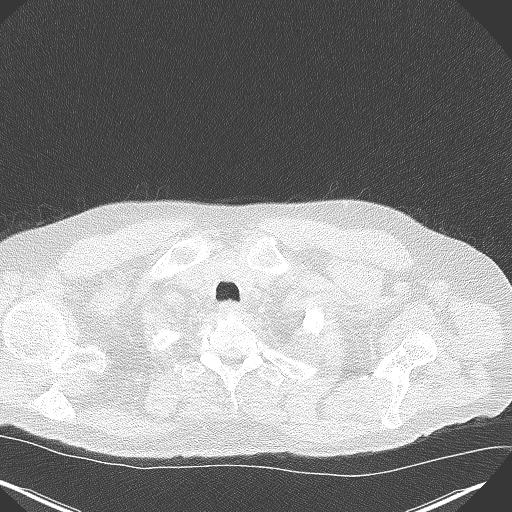

[Series 6: coronal · coronal · 0.63mm/px · 3 of 135 slices shown]
[im 27/135  lung]
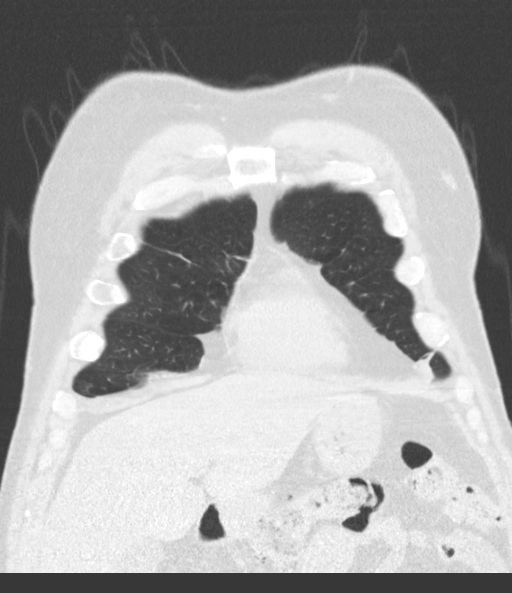
[im 54/135  lung]
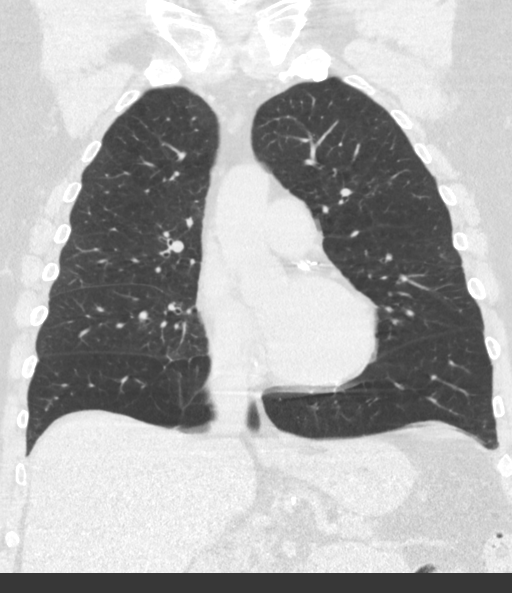
[im 81/135  lung]
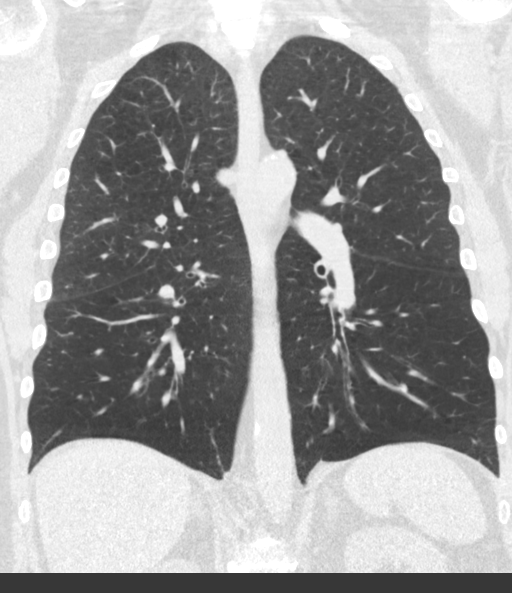

[14 of 36 positions shown; findings below may reference images not displayed]

FINDINGS: Cardiovascular: Heart size is normal. There is no significant
pericardial fluid, thickening or pericardial calcification. There is
aortic atherosclerosis, as well as atherosclerosis of the great
vessels of the mediastinum and the coronary arteries, including
calcified atherosclerotic plaque in the left main, left anterior
descending, left circumflex and right coronary arteries.

Mediastinum/Nodes: No pathologically enlarged mediastinal or hilar
lymph nodes. Please note that accurate exclusion of hilar adenopathy
is limited on noncontrast CT scans. Esophagus is unremarkable in
appearance. No axillary lymphadenopathy.

Lungs/Pleura: Previously noted nodule of concern in the medial
aspect of the right upper lobe has resolved, indicative of a benign
infectious or inflammatory etiology on the prior study. There is a
small left upper lobe pulmonary nodule (axial image 28 of series 3),
with a volume derived mean diameter of only 2.9 mm. No other larger
more suspicious appearing pulmonary nodules or masses are noted. No
acute consolidative airspace disease. No pleural effusions. Mild
diffuse bronchial wall thickening with mild centrilobular and
paraseptal emphysema.

Upper Abdomen: Aortic atherosclerosis.

Musculoskeletal: Small sclerotic lesion in the L1 vertebral body,
stable compared to prior study, likely to represent a bone island.
There are no other aggressive appearing lytic or blastic lesions
noted in the visualized portions of the skeleton.
IMPRESSION: 1. Lung-RADS 2S, benign appearance or behavior. Continue annual
screening with low-dose chest CT without contrast in 12 months.
2. The "S" modifier above refers to potentially clinically
significant non lung cancer related findings. Specifically, there is
aortic atherosclerosis, in addition to left main and three-vessel
coronary artery disease. Please note that although the presence of
coronary artery calcium documents the presence of coronary artery
disease, the severity of this disease and any potential stenosis
cannot be assessed on this non-gated CT examination. Assessment for
potential risk factor modification, dietary therapy or pharmacologic
therapy may be warranted, if clinically indicated.
3. Mild diffuse bronchial wall thickening with mild centrilobular
and paraseptal emphysema; imaging findings suggestive of underlying
COPD.

Aortic Atherosclerosis (09TP2-XGS.S) and Emphysema (09TP2-CXQ.R).

## 2022-07-04 ENCOUNTER — Encounter: Payer: Self-pay | Admitting: Pulmonary Disease

## 2022-07-04 ENCOUNTER — Other Ambulatory Visit (HOSPITAL_COMMUNITY): Payer: Self-pay

## 2022-07-04 ENCOUNTER — Ambulatory Visit (INDEPENDENT_AMBULATORY_CARE_PROVIDER_SITE_OTHER): Payer: 59 | Admitting: Pulmonary Disease

## 2022-07-04 ENCOUNTER — Telehealth: Payer: Self-pay

## 2022-07-04 VITALS — BP 132/74 | HR 68 | Temp 98.4°F | Ht 72.0 in | Wt 194.6 lb

## 2022-07-04 DIAGNOSIS — J4489 Other specified chronic obstructive pulmonary disease: Secondary | ICD-10-CM

## 2022-07-04 DIAGNOSIS — G4733 Obstructive sleep apnea (adult) (pediatric): Secondary | ICD-10-CM

## 2022-07-04 DIAGNOSIS — J9611 Chronic respiratory failure with hypoxia: Secondary | ICD-10-CM

## 2022-07-04 DIAGNOSIS — J449 Chronic obstructive pulmonary disease, unspecified: Secondary | ICD-10-CM | POA: Diagnosis not present

## 2022-07-04 DIAGNOSIS — G4710 Hypersomnia, unspecified: Secondary | ICD-10-CM

## 2022-07-04 DIAGNOSIS — G4730 Sleep apnea, unspecified: Secondary | ICD-10-CM

## 2022-07-04 MED ORDER — MODAFINIL 100 MG PO TABS: 100.0000 mg | ORAL_TABLET | Freq: Every day | ORAL | 5 refills | Status: DC

## 2022-07-04 MED ORDER — ALBUTEROL SULFATE (2.5 MG/3ML) 0.083% IN NEBU: 2.5000 mg | INHALATION_SOLUTION | Freq: Four times a day (QID) | RESPIRATORY_TRACT | 3 refills | Status: DC | PRN

## 2022-07-04 MED ORDER — BREZTRI AEROSPHERE 160-9-4.8 MCG/ACT IN AERO: 2.0000 | INHALATION_SPRAY | Freq: Two times a day (BID) | RESPIRATORY_TRACT | 3 refills | Status: DC

## 2022-07-04 MED ORDER — PREDNISONE 10 MG PO TABS: ORAL_TABLET | ORAL | 0 refills | Status: AC

## 2022-07-04 MED ORDER — ALBUTEROL SULFATE HFA 108 (90 BASE) MCG/ACT IN AERS: 2.0000 | INHALATION_SPRAY | Freq: Four times a day (QID) | RESPIRATORY_TRACT | 3 refills | Status: DC | PRN

## 2022-07-04 MED ORDER — LORATADINE 10 MG PO TABS: 10.0000 mg | ORAL_TABLET | Freq: Every day | ORAL | 3 refills | Status: DC

## 2022-07-04 MED ORDER — MONTELUKAST SODIUM 10 MG PO TABS: 10.0000 mg | ORAL_TABLET | Freq: Every day | ORAL | 3 refills | Status: DC

## 2022-07-04 NOTE — Progress Notes (Signed)
[facility]  Chief Complaint  Patient presents with   Follow-up    CPAP working well.  Wants to talk about prednisone and covid booster  And 90 day prescriptions  Breztri, albuterol inhaler and neb medications, loratadine, and fluticasone     Constitutional:  BP 132/74 (BP Location: Right Arm, Patient Position: Sitting)   Pulse 68   Temp 98.4 F (36.9 C) (Temporal)   Ht 6' (1.829 m)   Wt 194 lb 9.6 oz (88.3 kg)   SpO2 93% Comment: RA  BMI 26.39 kg/m   Past Medical History:  Diverticulitis, Colon polyp, Fatty liver, GERD, Back pain, Gastroparesis, HTN, DM type 2, Neuropathy, Vit D deficiency  Past Surgical History:  He  has a past surgical history that includes Esophagogastroduodenoscopy; Colonoscopy; Back surgery; Total hip arthroplasty (Bilateral, 2007); and Abdominal hernia repair.  Brief Summary:  Jerry Glenn is a 61 y.o. male former smoker with COPD, obstructive sleep apnea, and chronic hypoxic respiratory failure.      Subjective:   He has more cough and chest congestion over past few weeks.  Using albuterol more and his helps.  Has new pharmacy and needs 90 day scripts.  Using CPAP nightly without difficulty.  Had CT chest in March that was stable.  Asked about vaccines for this season.  Still has trouble with daytime alertness, focus and concentration.  This is impacting his ability to keep up with his daily activities.  Previously used provigil and this helped.  Physical Exam:   Appearance - well kempt   ENMT - no sinus tenderness, no oral exudate, no LAN, Mallampati 3 airway, no stridor  Respiratory - equal breath sounds bilaterally, no wheezing or rales  CV - s1s2 regular rate and rhythm, no murmurs  Ext - no clubbing, no edema  Skin - no rashes  Psych - normal mood and affect     Pulmonary testing:  PFT 10/12/17 >> FEV1 1.14 (32%), FEV1% 54, TLC 4.58 (64%), DLCO 30% A1AT 04/14/21 >> 154 PFT  06/24/21 >> FEV1 1.15 (29%), FEV1% 41, TLC 7.45 (100%), RV 4.42 (189%), DLCO 43%, +BD  Chest Imaging:  LDCT chest 06/25/17 >> 2 mm nodule RLLL, mild cylindrical BTX, scar lingula/RML/RUL LDCT chest 05/27/21 >> nodular area of architectural distortion in the medial aspect of the RUL 7.9 mm, bronchial wall thickening, mild centrilobular and paraseptal emphysema LDCT chest 01/03/22 >> resolution of RUL nodule, stable 2.9 mm LUL nodule, mild bronchial wall thickening and centrilobular/paraseptal emphysema  Sleep Tests:  PSG 05/16/11 >> AHI 27.8, SpO2 low 75% CPAP titration 10/13/17 >> CPAP 11 cm H2O with 2 liters O2 CPAP 05/31/22 to 06/29/22 >> used on 29 of 30 nights with average 5 hrs 38 min.  Average AHI 0.1 with CPAP 11 cm H2O  Social History:  He  reports that he quit smoking about 3 years ago. His smoking use included cigarettes. He has a 40.00 pack-year smoking history. He has never used smokeless tobacco.  Family History:  His family history includes COPD in his mother; Cancer in an other family member.     Assessment/Plan:   COPD with asthma, emphysema and chronic bronchitis. - he keeps prednisone on hand when his symptoms get worse - has more trouble in Fall allergy season - continue breztri and singulair - prn albuterol - if his symptoms progress, then consider assessment for a biologic agent - discussed RSV/influenza/COVID booster schedule for this season  Seasonal allergic rhinitis. - continue singulair  and flonase - prn claritin  Obstructive sleep apnea. - he is compliant with CPAP and reports benefit from therapy - he uses [facility] for his DME - continue CPAP 11 cm H2O with 2 liters oxygen at night  Regional bronchiectasis. - likely from prior respiratory infections - bronchial hygiene  Lung nodule, history of tobacco abuse. - follow up low dose CT chest in March 2024  Chronic respiratory failure with hypoxia. - goal SpO2 > 90% - 2 liters oxygen at night with CPAP  and prn during he day with exertion  Time Spent Involved in Patient Care on Day of Examination:  38 minutes  Follow up:   Patient Instructions  Follow up in 3 months  Medication List:   Allergies as of 07/04/2022   No Known Allergies      Medication List        Accurate as of July 04, 2022 11:08 AM. If you have any questions, ask your nurse or doctor.          albuterol 108 (90 Base) MCG/ACT inhaler Commonly known as: VENTOLIN HFA Inhale 2 puffs into the lungs every 6 (six) hours as needed for wheezing or shortness of breath. What changed: Another medication with the same name was added. Make sure you understand how and when to take each. Changed by: Coralyn Helling, MD   albuterol (2.5 MG/3ML) 0.083% nebulizer solution Commonly known as: PROVENTIL Take 3 mLs (2.5 mg total) by nebulization every 6 (six) hours as needed for wheezing or shortness of breath. What changed: You were already taking a medication with the same name, and this prescription was added. Make sure you understand how and when to take each. Changed by: Coralyn Helling, MD   aspirin 81 MG chewable tablet Chew 81 mg by mouth daily.   Breztri Aerosphere 160-9-4.8 MCG/ACT Aero Generic drug: Budeson-Glycopyrrol-Formoterol Inhale 2 puffs into the lungs in the morning and at bedtime.   dapagliflozin propanediol 10 MG Tabs tablet Commonly known as: FARXIGA Take 10 mg by mouth daily.   Fish Oil 1000 MG Caps Take by mouth in the morning and at bedtime.   fluticasone 50 MCG/ACT nasal spray Commonly known as: FLONASE Place 1 spray into both nostrils daily.   furosemide 20 MG tablet Commonly known as: LASIX Take 20 mg by mouth daily as needed.   gabapentin 300 MG capsule Commonly known as: NEURONTIN Take 300 mg by mouth 2 (two) times daily.   HYDROcodone-acetaminophen 10-325 MG tablet Commonly known as: NORCO Take 1 tablet by mouth 3 (three) times daily as needed.   ipratropium 0.02 % nebulizer  solution Commonly known as: ATROVENT Take 0.5 mg by nebulization every 6 (six) hours as needed for wheezing or shortness of breath.   lisinopril-hydrochlorothiazide 20-12.5 MG tablet Commonly known as: ZESTORETIC Take 2 tablets by mouth daily.   loratadine 10 MG tablet Commonly known as: CLARITIN Take 1 tablet (10 mg total) by mouth daily.   modafinil 100 MG tablet Commonly known as: PROVIGIL Take 1 tablet (100 mg total) by mouth daily. Started by: Coralyn Helling, MD   montelukast 10 MG tablet Commonly known as: SINGULAIR Take 1 tablet (10 mg total) by mouth at bedtime.   multivitamin tablet Take 1 tablet by mouth daily.   naproxen sodium 220 MG tablet Commonly known as: ALEVE Take 220 mg by mouth daily as needed.   ondansetron 4 MG tablet Commonly known as: ZOFRAN Take 4 mg by mouth 2 (two) times daily as needed for nausea  or vomiting.   potassium chloride 10 MEQ tablet Commonly known as: KLOR-CON M Take 10 mEq by mouth daily as needed.   predniSONE 10 MG tablet Commonly known as: DELTASONE Take 3 tablets (30 mg total) by mouth daily with breakfast for 2 days, THEN 2 tablets (20 mg total) daily with breakfast for 2 days, THEN 1 tablet (10 mg total) daily with breakfast for 2 days. Start taking on: July 04, 2022 Started by: Coralyn Helling, MD   RYBELSUS PO Take by mouth daily.   simvastatin 20 MG tablet Commonly known as: ZOCOR Take 20 mg by mouth daily.   zolpidem 10 MG tablet Commonly known as: AMBIEN Take 10 mg by mouth at bedtime as needed for sleep.        Signature:  Coralyn Helling, MD [facility] Pulmonary/Critical Care Pager - [phone] 07/04/2022, 11:08 AM

## 2022-07-04 NOTE — Telephone Encounter (Signed)
Patient Advocate Encounter   Received notification from [employer] that prior authorization is required for Modafinil 100mg  tablets  Submitted: 07-04-2022 Key [id number]

## 2022-07-04 NOTE — Patient Instructions (Signed)
Follow up in 3 months

## 2022-07-04 NOTE — Telephone Encounter (Signed)
Patient Advocate Encounter  Received a fax regarding Prior Authorization for Modafinil 180m.   Authorization has been DENIED due to criteria not met.  Medication authorization requires the following: (1) You have five or more obstructive respiratory events (apneas, hypopneas, or respiratory effort related arousals) per hour of sleep

## 2022-07-04 NOTE — Telephone Encounter (Signed)
Modafinil 100mg tablets     Orrell, Ashley N, CPhT  Pharmacy Technician Pharmacy Telephone Encounter Signed Creation Time:  07/04/2022  1:49 PM   Signed      Patient Advocate Encounter   Received a fax regarding Prior Authorization for Modafinil 100mg.    Authorization has been DENIED due to criteria not met.   Medication authorization requires the following: (1) You have five or more obstructive respiratory events (apneas, hypopneas, or respiratory effort related arousals) per hour of sleep            Dr. Sood please advise  

## 2022-07-05 ENCOUNTER — Other Ambulatory Visit (HOSPITAL_COMMUNITY): Payer: Self-pay

## 2022-07-06 ENCOUNTER — Other Ambulatory Visit (HOSPITAL_COMMUNITY): Payer: Self-pay

## 2022-07-06 NOTE — Telephone Encounter (Signed)
Patient Advocate Encounter   Prior Authorization APPEAL for Modafinil has been approved.   Determination letter to follow with approval dates, test billing results in $0 copay.   Burnell Blanks, CPhT Rx Patient Advocate Phone: [phone]  Routing to Dr. Craige Cotta to make aware.

## 2022-07-06 NOTE — Telephone Encounter (Signed)
Patient Advocate Encounter  Prior Authorization APPEAL for Modafinil has been approved.   Determination letter to follow with approval dates, test billing results in $0 copay.  Burnell Blanks, CPhT Rx Patient Advocate Phone: [phone]

## 2022-07-06 NOTE — Telephone Encounter (Signed)
Great!  Do I need to send in a new script?

## 2022-07-06 NOTE — Telephone Encounter (Signed)
Called and spoke to [facility] with [facility] in [place] and she states they do not need a new script and she will call the patient and let them know when med is ready for pick up. Nothing further needed at this time.

## 2022-09-29 ENCOUNTER — Encounter: Payer: Self-pay | Admitting: Pulmonary Disease

## 2022-09-29 ENCOUNTER — Ambulatory Visit (INDEPENDENT_AMBULATORY_CARE_PROVIDER_SITE_OTHER): Payer: 59 | Admitting: Pulmonary Disease

## 2022-09-29 VITALS — BP 134/78 | HR 92 | Temp 98.4°F | Ht 72.0 in | Wt 183.6 lb

## 2022-09-29 DIAGNOSIS — J9611 Chronic respiratory failure with hypoxia: Secondary | ICD-10-CM | POA: Diagnosis not present

## 2022-09-29 DIAGNOSIS — J449 Chronic obstructive pulmonary disease, unspecified: Secondary | ICD-10-CM

## 2022-09-29 DIAGNOSIS — G4733 Obstructive sleep apnea (adult) (pediatric): Secondary | ICD-10-CM | POA: Diagnosis not present

## 2022-09-29 DIAGNOSIS — G4710 Hypersomnia, unspecified: Secondary | ICD-10-CM | POA: Diagnosis not present

## 2022-09-29 DIAGNOSIS — J4489 Other specified chronic obstructive pulmonary disease: Secondary | ICD-10-CM | POA: Diagnosis not present

## 2022-09-29 DIAGNOSIS — J439 Emphysema, unspecified: Secondary | ICD-10-CM

## 2022-09-29 DIAGNOSIS — G4730 Sleep apnea, unspecified: Secondary | ICD-10-CM

## 2022-09-29 NOTE — Patient Instructions (Signed)
Follow up in 4 months 

## 2022-09-29 NOTE — Progress Notes (Signed)
[facility] [facility]  Chief Complaint  Patient presents with   Follow-up    Cpap working well having to use portable O2 more often lately than normal because of weather change     Constitutional:  BP 134/78   Pulse 92   Temp 98.4 F (36.9 C)   Ht 6' (1.829 m)   Wt 183 lb 9.6 oz (83.3 kg)   SpO2 91% Comment: 2LO2 cont  BMI 24.90 kg/m   Past Medical History:  Diverticulitis, Colon polyp, Fatty liver, GERD, Back pain, Gastroparesis, HTN, DM type 2, Neuropathy, Vit D deficiency  Past Surgical History:  He  has a past surgical history that includes Esophagogastroduodenoscopy; Colonoscopy; Back surgery; Total hip arthroplasty (Bilateral, 2007); and Abdominal hernia repair.  Brief Summary:  Jerry Glenn is a 61 y.o. male former smoker with COPD, obstructive sleep apnea, and chronic hypoxic respiratory failure.      Subjective:   His breathing feels worse when going from hot to cold or cold to hot.  Does better in steady temperature environment.  Not having much cough.  Not having sputum, wheeze, or chest tightness.  Uses CPAP nightly.  Mask fits well.  Takes modafinil in the morning and this helps his alertness.  Physical Exam:   Appearance - well kempt, wearing oxygen   ENMT - no sinus tenderness, no oral exudate, no LAN, Mallampati 3 airway, no stridor  Respiratory - decreased breath sounds bilaterally, no wheezing or rales  CV - s1s2 regular rate and rhythm, no murmurs  Ext - no clubbing, no edema  Skin - no rashes  Psych - normal mood and affect      Pulmonary testing:  PFT 10/12/17 >> FEV1 1.14 (32%), FEV1% 54, TLC 4.58 (64%), DLCO 30% A1AT 04/14/21 >> 154 PFT 06/24/21 >> FEV1 1.15 (29%), FEV1% 41, TLC 7.45 (100%), RV 4.42 (189%), DLCO 43%, +BD  Chest Imaging:  LDCT chest 06/25/17 >> 2 mm nodule RLLL, mild cylindrical BTX, scar lingula/RML/RUL LDCT chest 05/27/21 >> nodular area of architectural distortion in the medial  aspect of the RUL 7.9 mm, bronchial wall thickening, mild centrilobular and paraseptal emphysema LDCT chest 01/03/22 >> resolution of RUL nodule, stable 2.9 mm LUL nodule, mild bronchial wall thickening and centrilobular/paraseptal emphysema  Sleep Tests:  PSG 05/16/11 >> AHI 27.8, SpO2 low 75% CPAP titration 10/13/17 >> CPAP 11 cm H2O with 2 liters O2 CPAP 08/28/22 to 09/26/22 >> used on 29 of 30 nights with average 5 hrs 15 min.  Average AHI 0.1 with CPAP 11 cm H2O  Social History:  He  reports that he quit smoking about 3 years ago. His smoking use included cigarettes. He has a 40.00 pack-year smoking history. He has never used smokeless tobacco.  Family History:  His family history includes COPD in his mother; Cancer in an other family member.     Assessment/Plan:   COPD with asthma, emphysema and chronic bronchitis. - he keeps prednisone on hand when his symptoms get worse - has more trouble in Fall allergy season - continue breztri and singulair - prn albuterol - reviewed option of biologic agent if his symptoms progress  Seasonal allergic rhinitis. - flonase, singulair - prn claritin  Obstructive sleep apnea. - he is compliant with CPAP and reports benefit from therapy - he uses [facility] for his DME - continue CPAP 11 cm H2O with 2 liters oxygen  Residual daytime sleepiness. - modafinil 100 mg daily  Regional bronchiectasis. - likely from  prior respiratory infections - bronchial hygiene  Lung nodule, history of tobacco abuse. - follow up low dose CT chest in March 2024  Chronic respiratory failure with hypoxia. - goal SpO2 > 90% - 2 liters oxygen at night with CPAP and prn during he day with exertion  Time Spent Involved in Patient Care on Day of Examination:  36 minutes  Follow up:   Patient Instructions  Follow up in 4 months  Medication List:   Allergies as of 09/29/2022   No Known Allergies      Medication List        Accurate as of September 29, 2022 10:53 AM. If you have any questions, ask your nurse or doctor.          albuterol 108 (90 Base) MCG/ACT inhaler Commonly known as: VENTOLIN HFA Inhale 2 puffs into the lungs every 6 (six) hours as needed for wheezing or shortness of breath.   albuterol (2.5 MG/3ML) 0.083% nebulizer solution Commonly known as: PROVENTIL Take 3 mLs (2.5 mg total) by nebulization every 6 (six) hours as needed for wheezing or shortness of breath.   aspirin 81 MG chewable tablet Chew 81 mg by mouth daily.   Breztri Aerosphere 160-9-4.8 MCG/ACT Aero Generic drug: Budeson-Glycopyrrol-Formoterol Inhale 2 puffs into the lungs in the morning and at bedtime.   dapagliflozin propanediol 10 MG Tabs tablet Commonly known as: FARXIGA Take 10 mg by mouth daily.   Fish Oil 1000 MG Caps Take by mouth in the morning and at bedtime.   fluticasone 50 MCG/ACT nasal spray Commonly known as: FLONASE Place 1 spray into both nostrils daily.   furosemide 20 MG tablet Commonly known as: LASIX Take 20 mg by mouth daily as needed.   gabapentin 300 MG capsule Commonly known as: NEURONTIN Take 300 mg by mouth 2 (two) times daily.   HYDROcodone-acetaminophen 10-325 MG tablet Commonly known as: NORCO Take 1 tablet by mouth 3 (three) times daily as needed.   ipratropium 0.02 % nebulizer solution Commonly known as: ATROVENT Take 0.5 mg by nebulization every 6 (six) hours as needed for wheezing or shortness of breath.   lisinopril-hydrochlorothiazide 20-12.5 MG tablet Commonly known as: ZESTORETIC Take 2 tablets by mouth daily.   loratadine 10 MG tablet Commonly known as: CLARITIN Take 1 tablet (10 mg total) by mouth daily.   modafinil 100 MG tablet Commonly known as: PROVIGIL Take 1 tablet (100 mg total) by mouth daily.   montelukast 10 MG tablet Commonly known as: SINGULAIR Take 1 tablet (10 mg total) by mouth at bedtime.   multivitamin tablet Take 1 tablet by mouth daily.   naproxen sodium  220 MG tablet Commonly known as: ALEVE Take 220 mg by mouth daily as needed.   ondansetron 4 MG tablet Commonly known as: ZOFRAN Take 4 mg by mouth 2 (two) times daily as needed for nausea or vomiting.   potassium chloride 10 MEQ tablet Commonly known as: KLOR-CON M Take 10 mEq by mouth daily as needed.   RYBELSUS PO Take by mouth daily.   simvastatin 20 MG tablet Commonly known as: ZOCOR Take 20 mg by mouth daily.   zolpidem 10 MG tablet Commonly known as: AMBIEN Take 10 mg by mouth at bedtime as needed for sleep.        Signature:  Coralyn Helling, MD [facility]monary/Critical Care Pager - [phone] 09/29/2022, 10:53 AM

## 2022-10-13 ENCOUNTER — Telehealth: Payer: Self-pay | Admitting: Pulmonary Disease

## 2022-10-13 DIAGNOSIS — J439 Emphysema, unspecified: Secondary | ICD-10-CM

## 2022-10-13 DIAGNOSIS — J9611 Chronic respiratory failure with hypoxia: Secondary | ICD-10-CM

## 2022-10-13 NOTE — Telephone Encounter (Signed)
LMTCB at the number provided   

## 2022-10-18 NOTE — Telephone Encounter (Signed)
Called and spoke with Melissa. She stated that the patient had called their office asking for a status update for a POC. He was under the impression that a POC had been ordered. I reviewed the OV note from 12/01 and did not see any mentioning of a POC order. Per Melissa, the patient travels a lot and he is using a lot of tanks. I advised her that I would check with Dr. Craige Cotta to see if he was ok with [place] placing an order for a POC.   Per Efraim Kaufmann, the order for the POC needs to be sent to [phone].  Dr. Craige Cotta, please advise if you are ok with [place] placing an order for a POC. Thanks!

## 2022-10-22 NOTE — Telephone Encounter (Signed)
Okay to send order for POC at 2 liters.

## 2022-10-24 ENCOUNTER — Telehealth: Payer: Self-pay | Admitting: *Deleted

## 2022-10-24 MED ORDER — AZITHROMYCIN 250 MG PO TABS: ORAL_TABLET | ORAL | 0 refills | Status: DC

## 2022-10-24 MED ORDER — PREDNISONE 20 MG PO TABS: 20.0000 mg | ORAL_TABLET | Freq: Every day | ORAL | 0 refills | Status: DC

## 2022-10-24 NOTE — Telephone Encounter (Signed)
Called and spoke with pt letting him know recs per TP and he verbalized understanding. Meds have been sent to preferred pharmacy. Nothing further needed. 

## 2022-10-24 NOTE — Telephone Encounter (Signed)
Primary Pulmonologist: Dr. Craige Cotta Last office visit and with whom: 09/29/22 Dr. Craige Cotta  What do we see them for (pulmonary problems): OSA and COPD overlap  Last OV assessment/plan: see below   Was appointment offered to patient (explain)?  No availability in [facility] office    Reason for call: Patient called and states that he has been coughing up green sputum and has chest congestion.   Started this past weekend but is worse today. Has taken Mucinex daily.  Denies increased SOB/ wheezing/ fever/ n/v/d  [facility]  Asking for an antibiotic and prednisone. Sending to DOD since Dr. Craige Cotta is out of office today. Please advise.   No Known Allergies  Immunization History  Administered Date(s) Administered   Influenza,inj,quad, With Preservative 07/30/2020   Unspecified SARS-COV-2 Vaccination 10/31/2019    Assessment/Plan:    COPD with asthma, emphysema and chronic bronchitis. - he keeps prednisone on hand when his symptoms get worse - has more trouble in Fall allergy season - continue breztri and singulair - prn albuterol - reviewed option of biologic agent if his symptoms progress   Seasonal allergic rhinitis. - flonase, singulair - prn claritin   Obstructive sleep apnea. - he is compliant with CPAP and reports benefit from therapy - he uses [facility] for his DME - continue CPAP 11 cm H2O with 2 liters oxygen   Residual daytime sleepiness. - modafinil 100 mg daily   Regional bronchiectasis. - likely from prior respiratory infections - bronchial hygiene   Lung nodule, history of tobacco abuse. - follow up low dose CT chest in March 2024   Chronic respiratory failure with hypoxia. - goal SpO2 > 90% - 2 liters oxygen at night with CPAP and prn during he day with exertion

## 2022-10-24 NOTE — Telephone Encounter (Signed)
Order has been placed for pt to receive a POC from [employer]. Attempted to call pt to let him know that this had been done but unable to reach. Left detailed message per  DPR for pt. Nothing further needed.

## 2022-10-24 NOTE — Telephone Encounter (Signed)
Sounds like he is having a COPD exacerbation.  Was last seen September 29, 2022. Recommend Z-Pak No. 1 take as directed. Prednisone 20 mg daily for 5 days, take with food. Continue on Breztri twice daily. Albuterol as needed If not feeling better will need office visit for further evaluation  Please contact office for sooner follow up if symptoms do not improve or worsen or seek emergency care

## 2022-11-01 ENCOUNTER — Other Ambulatory Visit: Payer: Self-pay | Admitting: Pulmonary Disease

## 2022-11-10 ENCOUNTER — Telehealth: Payer: Self-pay

## 2022-11-10 NOTE — Telephone Encounter (Signed)
PA request received via CMM for Modafinil 100MG  tablets  PA has been submitted through [facility] and has been APPROVED from 11/10/2022-11/09/2023  Key: [facility]

## 2022-12-02 ENCOUNTER — Other Ambulatory Visit: Payer: Self-pay | Admitting: Pulmonary Disease

## 2023-01-01 ENCOUNTER — Other Ambulatory Visit: Payer: Self-pay | Admitting: Pulmonary Disease

## 2023-01-02 ENCOUNTER — Other Ambulatory Visit: Payer: Self-pay

## 2023-01-02 MED ORDER — MODAFINIL 100 MG PO TABS: 100.0000 mg | ORAL_TABLET | Freq: Every day | ORAL | 5 refills | Status: DC

## 2023-01-04 ENCOUNTER — Ambulatory Visit (HOSPITAL_COMMUNITY)
Admission: RE | Admit: 2023-01-04 | Discharge: 2023-01-04 | Disposition: A | Discharge status: Home / Self Care | Payer: Medicare (Managed Care) | Source: Ambulatory Visit | Attending: Acute Care | Admitting: Acute Care

## 2023-01-04 DIAGNOSIS — Z87891 Personal history of nicotine dependence: Secondary | ICD-10-CM | POA: Diagnosis present

## 2023-01-09 ENCOUNTER — Other Ambulatory Visit: Payer: Self-pay

## 2023-01-09 DIAGNOSIS — Z87891 Personal history of nicotine dependence: Secondary | ICD-10-CM

## 2023-01-09 DIAGNOSIS — Z122 Encounter for screening for malignant neoplasm of respiratory organs: Secondary | ICD-10-CM

## 2023-01-11 ENCOUNTER — Telehealth: Payer: Self-pay | Admitting: Acute Care

## 2023-01-11 NOTE — Telephone Encounter (Signed)
CT results have been faxed to PCP. Order placed for 12 mth follow up lung screening CT.

## 2023-01-11 NOTE — Telephone Encounter (Signed)
I have called the patient with the results of his screening scan. I explained the d=scan was read as a LR 2, 12 month follow up. There were incidental findings of CAD, aortic atherosclerosis, mild emphysema, renal lesion for which recommendation is a non-emergent US of the kidney. I explained from a Lung cancer perspective , everything looks fine, 12 month follow up. We discussed the cardiology findings. He is on statin therapy and is seen by a cardiologist.  I told him he needs an US of the kidney to better evaluate the renal finding. He lived in [place] and wants his PCP to follow up on this.  I called Dr. Danae Orleans office. I wanted to relay this message to his PCP. Stormy answered the phone. I asked if she wanted DOB to pull up the patient to send a message, and she told me " In [place], we do all communication through fax". I explained there was a finding on his screening scan that needed follow up. I asked if they send electronic messages to the MD's to make them aware. Again, she told me in [place] all communications are done via fax. She didn't document his name etc. I told her they do not have their fax number on their website, which makes it hard to fax anything without having to make a call. She asked me to fax info and add to the cover sheet, urgent, patient needs follow up. She would not take any information down. She assured me Dr. Macarthur Critchley would get the information from the fax. She could not tell me how timely that would be.   Denise, 12 month follow up. We need to fax results to PCP as they do in [place] per [place] with [facility] He needs a follow up non urgent renal US to better evaluate  Indeterminate intermediate attenuation lesion partially imaged in the anterior aspect of the interpolar region of the left kidney.  This may simply represent a proteinaceous/hemorrhagic cysts, however, further evaluation with nonemergent renal ultrasound is recommended in the near future  to exclude the possibility of malignancy.  Fax number is [phone].

## 2023-01-24 ENCOUNTER — Other Ambulatory Visit: Payer: Self-pay | Admitting: Pulmonary Disease

## 2023-02-08 ENCOUNTER — Telehealth: Payer: Self-pay | Admitting: *Deleted

## 2023-02-08 MED ORDER — FLUTICASONE PROPIONATE 50 MCG/ACT NA SUSP: 1.0000 | Freq: Every day | NASAL | 1 refills | Status: DC

## 2023-02-08 NOTE — Telephone Encounter (Signed)
Called and spoke w/ pts PCP they advised they did receive the CT scan results by fax. I requested that the scheduler reach out to pt to schedule OV f/u w/ Dr.Jannach. She verbalized that she would call pt promptly.  I called pt myself to make him aware they did get our fax and that someone from the PCP office should be reaching out.   He verbalized understanding. Refill of flonase sent to preferred pharmacy. NFN, closing encounter.

## 2023-02-08 NOTE — Telephone Encounter (Signed)
Called patient to r/s appointment in May. Next available 05/07/23 patient is okay with that.   While on the phone, patient states that he had a LCS with Kandice Robinsons on 01/04/23 with abnormal findings of kidney and wanted to check to see if PCP received that information via fax. (See encounter from 01/11/2023 via Kandice Robinsons)   Patient states that PCP office (Dr. Loni Dolly) has not reached out to call and schedule appointment. Patient would like a call back on that when any information can be given out.   Patient would also like to see if Dr. Craige Cotta would be willing to refill a prescription of fluticasone propionate as needed for allergies.   Please call and advise patient. Patient's number is [phone]

## 2023-02-28 ENCOUNTER — Ambulatory Visit: Payer: 59 | Admitting: Pulmonary Disease

## 2023-03-02 ENCOUNTER — Ambulatory Visit: Payer: 59 | Admitting: Pulmonary Disease

## 2023-03-06 ENCOUNTER — Other Ambulatory Visit: Payer: Self-pay | Admitting: Pulmonary Disease

## 2023-03-08 ENCOUNTER — Other Ambulatory Visit: Payer: Self-pay | Admitting: Pulmonary Disease

## 2023-05-07 ENCOUNTER — Ambulatory Visit (INDEPENDENT_AMBULATORY_CARE_PROVIDER_SITE_OTHER): Payer: Medicaid - Out of State | Admitting: Pulmonary Disease

## 2023-05-07 ENCOUNTER — Encounter: Payer: Self-pay | Admitting: Pulmonary Disease

## 2023-05-07 VITALS — BP 97/63 | HR 109 | Ht 72.0 in | Wt 191.4 lb

## 2023-05-07 DIAGNOSIS — G4733 Obstructive sleep apnea (adult) (pediatric): Secondary | ICD-10-CM | POA: Diagnosis not present

## 2023-05-07 DIAGNOSIS — J9611 Chronic respiratory failure with hypoxia: Secondary | ICD-10-CM

## 2023-05-07 DIAGNOSIS — J4489 Other specified chronic obstructive pulmonary disease: Secondary | ICD-10-CM

## 2023-05-07 DIAGNOSIS — G4730 Sleep apnea, unspecified: Secondary | ICD-10-CM

## 2023-05-07 DIAGNOSIS — G4710 Hypersomnia, unspecified: Secondary | ICD-10-CM | POA: Diagnosis not present

## 2023-05-07 DIAGNOSIS — J439 Emphysema, unspecified: Secondary | ICD-10-CM

## 2023-05-07 DIAGNOSIS — J449 Chronic obstructive pulmonary disease, unspecified: Secondary | ICD-10-CM

## 2023-05-07 DIAGNOSIS — Z716 Tobacco abuse counseling: Secondary | ICD-10-CM

## 2023-05-07 MED ORDER — VARENICLINE TARTRATE 0.5 MG PO TABS: ORAL_TABLET | ORAL | 0 refills | Status: AC

## 2023-05-07 MED ORDER — NICOTINE 14 MG/24HR TD PT24: 14.0000 mg | MEDICATED_PATCH | Freq: Every day | TRANSDERMAL | 0 refills | Status: AC

## 2023-05-07 MED ORDER — VARENICLINE TARTRATE 1 MG PO TABS: 1.0000 mg | ORAL_TABLET | Freq: Two times a day (BID) | ORAL | 2 refills | Status: AC

## 2023-05-07 NOTE — Patient Instructions (Signed)
Chantix (varenicline) 0.5 mg daily for 3 days, then 0.5 mg twice daily for next 4 days, then 1 mg twice daily.  Nicotine patch daily as needed.  Follow up in 3 months.

## 2023-05-07 NOTE — Progress Notes (Signed)
[facility]  Chief Complaint  Patient presents with   Follow-up    Constitutional:  BP 97/63   Pulse (!) 109   Ht 6' (1.829 m)   Wt 191 lb 6.4 oz (86.8 kg)   SpO2 93% Comment: 3L cont  BMI 25.96 kg/m   Past Medical History:  Diverticulitis, Colon polyp, Fatty liver, GERD, Back pain, Gastroparesis, HTN, DM type 2, Neuropathy, Vit D deficiency  Past Surgical History:  He  has a past surgical history that includes Esophagogastroduodenoscopy; Colonoscopy; Back surgery; Total hip arthroplasty (Bilateral, 2007); and Abdominal hernia repair.  Brief Summary:  Jerry Glenn is a 62 y.o. male former smoker with COPD, obstructive sleep apnea, and chronic hypoxic respiratory failure.      Subjective:   He has trouble with his breathing in hot weather.  Does okay inside.  Not needing singulair or claritin during Summer months, but will start again in the fall.  Uses 3 liters oxygen.  No issues with CPAP.  CT chest from March was stable.  He still smokes intermittently and needs something to help with his cravings.  Chantix helped before.  Physical Exam:   Appearance - well kempt, wearing oxygen  ENMT - no sinus tenderness, no oral exudate, no LAN, Mallampati 3 airway, no stridor  Respiratory - equal breath sounds bilaterally, no wheezing or rales  CV - s1s2 regular rate and rhythm, no murmurs  Ext - no clubbing, no edema  Skin - no rashes  Psych - normal mood and affect      Pulmonary testing:  PFT 10/12/17 >> FEV1 1.14 (32%), FEV1% 54, TLC 4.58 (64%), DLCO 30% A1AT 04/14/21 >> 154 PFT 06/24/21 >> FEV1 1.15 (29%), FEV1% 41, TLC 7.45 (100%), RV 4.42 (189%), DLCO 43%, +BD  Chest Imaging:  LDCT chest 06/25/17 >> 2 mm nodule RLLL, mild cylindrical BTX, scar lingula/RML/RUL LDCT chest 05/27/21 >> nodular area of architectural distortion in the medial aspect of the RUL 7.9 mm, bronchial wall thickening, mild centrilobular and paraseptal  emphysema LDCT chest 01/03/22 >> resolution of RUL nodule, stable 2.9 mm LUL nodule, mild bronchial wall thickening and centrilobular/paraseptal emphysema LDCT chest 01/06/23 >> 3.2 mm LUL nodule, mild bronchial thickening, mild emphysema  Sleep Tests:  PSG 05/16/11 >> AHI 27.8, SpO2 low 75% CPAP titration 10/13/17 >> CPAP 11 cm H2O with 2 liters O2 CPAP 08/28/22 to 09/26/22 >> used on 29 of 30 nights with average 5 hrs 15 min.  Average AHI 0.1 with CPAP 11 cm H2O  Social History:  He  reports that he quit smoking about 4 years ago. His smoking use included cigarettes. He has a 40.00 pack-year smoking history. He has never used smokeless tobacco.  Family History:  His family history includes COPD in his mother; Cancer in an other family member.     Assessment/Plan:   COPD with asthma, emphysema and chronic bronchitis. - he keeps prednisone on hand when his symptoms get worse - has more trouble in Fall allergy season - continue breztri two puffs bid - he uses singulair in the fall - prn albuterol - reviewed option of biologic agent if his symptoms progress  Seasonal allergic rhinitis. - prn singulair, flonase, clariting  Obstructive sleep apnea. - he is compliant with CPAP and reports benefit from therapy - he uses [facility] for his DME - continue CPAP 11 cm H2O with 2 liters oxygen  Residual daytime sleepiness. - modafinil 100 mg daily  Regional bronchiectasis. -  likely from prior respiratory infections - bronchial hygiene  Lung nodule, history of tobacco abuse. - follow up low dose CT chest in March 2025  Chronic respiratory failure with hypoxia. - goal SpO2 > 90% - 3 liters oxygen at night with CPAP and prn during he day with exertion  Tobacco abuse. - spent 6 minutes reviewing treatment options - will have his try chantix with nicotine patch; discussed dosing schedule and side effects to monitor for   Time Spent Involved in Patient Care on Day of Examination:  37  minutes  Follow up:   Patient Instructions  Chantix (varenicline) 0.5 mg daily for 3 days, then 0.5 mg twice daily for next 4 days, then 1 mg twice daily.  Nicotine patch daily as needed.  Follow up in 3 months.  Medication List:   Allergies as of 05/07/2023   No Known Allergies      Medication List        Accurate as of May 07, 2023  4:19 PM. If you have any questions, ask your nurse or doctor.          STOP taking these medications    azithromycin 250 MG tablet Commonly known as: ZITHROMAX Stopped by: Coralyn Helling, MD   predniSONE 20 MG tablet Commonly known as: DELTASONE Stopped by: Coralyn Helling, MD       TAKE these medications    albuterol 108 (90 Base) MCG/ACT inhaler Commonly known as: VENTOLIN HFA Inhale 2 puffs into the lungs every 6 (six) hours as needed for wheezing or shortness of breath.   albuterol (2.5 MG/3ML) 0.083% nebulizer solution Commonly known as: PROVENTIL INHALE 3 mls BY MOUTH via NEBULIZER EVERY 6 HOURS AS NEEDED FOR WHEEZING OR SHORTNESS OF BREATH   aspirin 81 MG chewable tablet Chew 81 mg by mouth daily.   Breztri Aerosphere 160-9-4.8 MCG/ACT Aero Generic drug: Budeson-Glycopyrrol-Formoterol Inhale 2 puffs into the lungs in the morning and at bedtime.   dapagliflozin propanediol 10 MG Tabs tablet Commonly known as: FARXIGA Take 10 mg by mouth daily.   Fish Oil 1000 MG Caps Take by mouth in the morning and at bedtime.   fluticasone 50 MCG/ACT nasal spray Commonly known as: FLONASE Place 1 spray into both nostrils daily.   furosemide 20 MG tablet Commonly known as: LASIX Take 20 mg by mouth daily as needed.   gabapentin 300 MG capsule Commonly known as: NEURONTIN Take 300 mg by mouth 2 (two) times daily.   HYDROcodone-acetaminophen 10-325 MG tablet Commonly known as: NORCO Take 1 tablet by mouth 3 (three) times daily as needed.   ipratropium 0.02 % nebulizer solution Commonly known as: ATROVENT Take 0.5 mg by  nebulization every 6 (six) hours as needed for wheezing or shortness of breath.   lisinopril-hydrochlorothiazide 20-12.5 MG tablet Commonly known as: ZESTORETIC Take 2 tablets by mouth daily.   loratadine 10 MG tablet Commonly known as: CLARITIN Take 10 mg by mouth daily. What changed: Another medication with the same name was removed. Continue taking this medication, and follow the directions you see here. Changed by: Coralyn Helling, MD   modafinil 100 MG tablet Commonly known as: PROVIGIL TAKE 1 TABLET BY MOUTH DAILY   montelukast 10 MG tablet Commonly known as: SINGULAIR Take 10 mg by mouth at bedtime. What changed: Another medication with the same name was removed. Continue taking this medication, and follow the directions you see here. Changed by: Coralyn Helling, MD   multivitamin tablet Take 1 tablet by mouth daily.  naproxen sodium 220 MG tablet Commonly known as: ALEVE Take 220 mg by mouth daily as needed.   nicotine 14 mg/24hr patch Commonly known as: NICODERM CQ - dosed in mg/24 hours Place 1 patch (14 mg total) onto the skin daily. Started by: Coralyn Helling, MD   ondansetron 4 MG tablet Commonly known as: ZOFRAN Take 4 mg by mouth 2 (two) times daily as needed for nausea or vomiting.   potassium chloride 10 MEQ tablet Commonly known as: KLOR-CON M Take 10 mEq by mouth daily as needed.   RYBELSUS PO Take by mouth daily.   simvastatin 20 MG tablet Commonly known as: ZOCOR Take 20 mg by mouth daily.   varenicline 0.5 MG tablet Commonly known as: CHANTIX Take 1 tablet (0.5 mg total) by mouth daily for 3 days, THEN 1 tablet (0.5 mg total) 2 (two) times daily for 4 days. Start taking on: May 07, 2023 Started by: Coralyn Helling, MD   varenicline 1 MG tablet Commonly known as: CHANTIX Take 1 tablet (1 mg total) by mouth 2 (two) times daily. Started by: Coralyn Helling, MD   zolpidem 10 MG tablet Commonly known as: AMBIEN Take 10 mg by mouth at bedtime as needed for  sleep.        Signature:  Coralyn Helling, MD [facility] Pulmonary/Critical Care Pager - [phone] 05/07/2023, 4:19 PM

## 2023-05-08 ENCOUNTER — Telehealth: Payer: Self-pay

## 2023-05-08 NOTE — Telephone Encounter (Signed)
*  Pulm  PA request received for Varenicline Tartrate 0.5MG  tablets  PA submitted to [facility] via CMM and has been APPROVED from 05/08/2023-08/05/2023  Key: [id number]

## 2023-06-12 ENCOUNTER — Ambulatory Visit (INDEPENDENT_AMBULATORY_CARE_PROVIDER_SITE_OTHER): Payer: Medicaid - Out of State | Admitting: Pulmonary Disease

## 2023-06-12 ENCOUNTER — Encounter: Payer: Self-pay | Admitting: Pulmonary Disease

## 2023-06-12 VITALS — BP 134/60 | HR 92 | Ht 72.0 in | Wt 193.0 lb

## 2023-06-12 DIAGNOSIS — J4489 Other specified chronic obstructive pulmonary disease: Secondary | ICD-10-CM | POA: Diagnosis not present

## 2023-06-12 DIAGNOSIS — J9611 Chronic respiratory failure with hypoxia: Secondary | ICD-10-CM

## 2023-06-12 DIAGNOSIS — J439 Emphysema, unspecified: Secondary | ICD-10-CM | POA: Diagnosis not present

## 2023-06-12 DIAGNOSIS — G4733 Obstructive sleep apnea (adult) (pediatric): Secondary | ICD-10-CM

## 2023-06-12 NOTE — Patient Instructions (Signed)
Will arrange for an overnight oxygen test with CPAP and supplemental oxygen.  Follow up in 4 months.

## 2023-06-12 NOTE — Progress Notes (Signed)
[facility]  Chief Complaint  Patient presents with   Hospitalization Follow-up    Constitutional:  BP 134/60 (BP Location: Left Arm, Cuff Size: Normal)   Pulse 92   Ht 6' (1.829 m)   Wt 193 lb (87.5 kg)   SpO2 96%   BMI 26.18 kg/m   Past Medical History:  Diverticulitis, Colon polyp, Fatty liver, GERD, Back pain, Gastroparesis, HTN, DM type 2, Neuropathy, Vit D deficiency  Past Surgical History:  He  has a past surgical history that includes Esophagogastroduodenoscopy; Colonoscopy; Back surgery; Total hip arthroplasty (Bilateral, 2007); and Abdominal hernia repair.  Brief Summary:  Jerry Glenn is a 62 y.o. male former smoker with COPD, obstructive sleep apnea, and chronic hypoxic respiratory failure.      Subjective:   He was admitted to [facility] in July for respiratory failure.  Needed Bipap during his hospitalization for hypercapnia.  Feels better now.  Back to using CPAP and 2 liters oxygen at night.  Doesn't need oxygen all the time during the day.  Not having cough, wheeze, or sputum.  Feels like he is able to keep up with activity okay.  No having trouble breathing at night.  On Abx for leg cellulits - improving.  Physical Exam:   Appearance - well kempt, wearing oxygen  ENMT - no sinus tenderness, no oral exudate, no LAN, Mallampati 3 airway, no stridor  Respiratory - decreased breath sounds bilaterally, no wheezing or rales  CV - s1s2 regular rate and rhythm, no murmurs  Ext - no clubbing, no edema  Skin - brawny color of lower legs  Psych - normal mood and affect      Pulmonary testing:  PFT 10/12/17 >> FEV1 1.14 (32%), FEV1% 54, TLC 4.58 (64%), DLCO 30% A1AT 04/14/21 >> 154 PFT 06/24/21 >> FEV1 1.15 (29%), FEV1% 41, TLC 7.45 (100%), RV 4.42 (189%), DLCO 43%, +BD  Chest Imaging:  LDCT chest 06/25/17 >> 2 mm nodule RLLL, mild cylindrical BTX, scar lingula/RML/RUL LDCT chest 05/27/21 >> nodular area of  architectural distortion in the medial aspect of the RUL 7.9 mm, bronchial wall thickening, mild centrilobular and paraseptal emphysema LDCT chest 01/03/22 >> resolution of RUL nodule, stable 2.9 mm LUL nodule, mild bronchial wall thickening and centrilobular/paraseptal emphysema LDCT chest 01/06/23 >> 3.2 mm LUL nodule, mild bronchial thickening, mild emphysema  Sleep Tests:  PSG 05/16/11 >> AHI 27.8, SpO2 low 75% CPAP titration 10/13/17 >> CPAP 11 cm H2O with 2 liters O2 CPAP 05/13/23 to 06/11/23 >> used on 23 of 30 nights with average 5 hrs 53 min.  Average AHI 0.1 with CPAP 11 cm H2O  Social History:  He  reports that he quit smoking about 4 years ago. His smoking use included cigarettes. He started smoking about 44 years ago. He has a 40 pack-year smoking history. He has never used smokeless tobacco.  Family History:  His family history includes COPD in his mother; Cancer in an other family member.     Assessment/Plan:   COPD with asthma, emphysema and chronic bronchitis. - he keeps prednisone on hand when his symptoms get worse - has more trouble in Fall allergy season - continue breztri two puffs bid - he uses singulair in the fall - prn albuterol  Seasonal allergic rhinitis. - prn singulair, flonase, clariting  Obstructive sleep apnea. Chronic hypoxic, hypercapnic respiratory failure. - he is compliant with CPAP and reports benefit from therapy - he uses [facility] for his DME -  continue CPAP 11 cm H2O with 2 liters oxygen - will arrange for ONO with CPAP and 2 liters and then determine if he needs an in lab titration study  Residual daytime sleepiness. - modafinil 100 mg daily  Regional bronchiectasis. - likely from prior respiratory infections - bronchial hygiene  Lung nodule, history of tobacco abuse. - follow up low dose CT chest in March 2025   Time Spent Involved in Patient Care on Day of Examination:  38 minutes  Follow up:   Patient Instructions  Will  arrange for an overnight oxygen test with CPAP and supplemental oxygen.  Follow up in 4 months.  Medication List:   Allergies as of 06/12/2023   No Known Allergies      Medication List        Accurate as of June 12, 2023  3:52 PM. If you have any questions, ask your nurse or doctor.          albuterol 108 (90 Base) MCG/ACT inhaler Commonly known as: VENTOLIN HFA Inhale 2 puffs into the lungs every 6 (six) hours as needed for wheezing or shortness of breath.   albuterol (2.5 MG/3ML) 0.083% nebulizer solution Commonly known as: PROVENTIL INHALE 3 mls BY MOUTH via NEBULIZER EVERY 6 HOURS AS NEEDED FOR WHEEZING OR SHORTNESS OF BREATH   aspirin 81 MG chewable tablet Chew 81 mg by mouth daily.   Breztri Aerosphere 160-9-4.8 MCG/ACT Aero Generic drug: Budeson-Glycopyrrol-Formoterol Inhale 2 puffs into the lungs in the morning and at bedtime.   dapagliflozin propanediol 10 MG Tabs tablet Commonly known as: FARXIGA Take 10 mg by mouth daily.   Fish Oil 1000 MG Caps Take by mouth in the morning and at bedtime.   fluticasone 50 MCG/ACT nasal spray Commonly known as: FLONASE Place 1 spray into both nostrils daily.   furosemide 20 MG tablet Commonly known as: LASIX Take 20 mg by mouth daily as needed.   gabapentin 300 MG capsule Commonly known as: NEURONTIN Take 300 mg by mouth 2 (two) times daily.   HYDROcodone-acetaminophen 10-325 MG tablet Commonly known as: NORCO Take 1 tablet by mouth 3 (three) times daily as needed.   ipratropium 0.02 % nebulizer solution Commonly known as: ATROVENT Take 0.5 mg by nebulization every 6 (six) hours as needed for wheezing or shortness of breath.   lisinopril-hydrochlorothiazide 20-12.5 MG tablet Commonly known as: ZESTORETIC Take 2 tablets by mouth daily.   loratadine 10 MG tablet Commonly known as: CLARITIN Take 10 mg by mouth daily.   modafinil 100 MG tablet Commonly known as: PROVIGIL TAKE 1 TABLET BY MOUTH DAILY    montelukast 10 MG tablet Commonly known as: SINGULAIR Take 10 mg by mouth at bedtime.   multivitamin tablet Take 1 tablet by mouth daily.   naproxen sodium 220 MG tablet Commonly known as: ALEVE Take 220 mg by mouth daily as needed.   nicotine 14 mg/24hr patch Commonly known as: NICODERM CQ - dosed in mg/24 hours Place 1 patch (14 mg total) onto the skin daily.   ondansetron 4 MG tablet Commonly known as: ZOFRAN Take 4 mg by mouth 2 (two) times daily as needed for nausea or vomiting.   potassium chloride 10 MEQ tablet Commonly known as: KLOR-CON M Take 10 mEq by mouth daily as needed.   RYBELSUS PO Take by mouth daily.   simvastatin 20 MG tablet Commonly known as: ZOCOR Take 20 mg by mouth daily.   varenicline 1 MG tablet Commonly known as: CHANTIX Take 1  tablet (1 mg total) by mouth 2 (two) times daily.   zolpidem 10 MG tablet Commonly known as: AMBIEN Take 10 mg by mouth at bedtime as needed for sleep.        Signature:  Coralyn Helling, MD [facility]Critical [facility] Pager - [phone] 06/12/2023, 3:52 PM

## 2023-07-17 ENCOUNTER — Other Ambulatory Visit: Payer: Self-pay | Admitting: Pulmonary Disease

## 2023-07-18 ENCOUNTER — Inpatient Hospital Stay (HOSPITAL_BASED_OUTPATIENT_CLINIC_OR_DEPARTMENT_OTHER): Payer: Medicaid - Out of State | Admitting: Pulmonary Disease

## 2023-07-24 ENCOUNTER — Other Ambulatory Visit: Payer: Self-pay | Admitting: Pulmonary Disease

## 2023-07-29 ENCOUNTER — Other Ambulatory Visit: Payer: Self-pay | Admitting: Pulmonary Disease

## 2023-09-25 ENCOUNTER — Telehealth: Payer: Self-pay | Admitting: Family Medicine

## 2023-09-25 MED ORDER — FLUTICASONE PROPIONATE 50 MCG/ACT NA SUSP: 1.0000 | Freq: Every day | NASAL | 3 refills | Status: AC

## 2023-09-25 MED ORDER — MODAFINIL 100 MG PO TABS: 100.0000 mg | ORAL_TABLET | Freq: Every day | ORAL | 3 refills | Status: DC

## 2023-09-25 MED ORDER — ALBUTEROL SULFATE HFA 108 (90 BASE) MCG/ACT IN AERS: 2.0000 | INHALATION_SPRAY | Freq: Four times a day (QID) | RESPIRATORY_TRACT | 3 refills | Status: AC | PRN

## 2023-09-25 NOTE — Telephone Encounter (Signed)
Patient notified

## 2023-09-25 NOTE — Telephone Encounter (Signed)
Patient of Dr. Craige Cotta, seen in August. I will send in refills. Due for follow-up in [place]

## 2023-09-25 NOTE — Telephone Encounter (Signed)
Patient is having problems getting refills on these medications:  Albuterol inhaler 2 puffs q 6 hours, PRN--Fluticasone nasal spray and Modafinil 100 mg tabs 1 daily---  [facility]  [phone] call back [phone]

## 2023-12-04 ENCOUNTER — Telehealth: Payer: Self-pay

## 2023-12-04 ENCOUNTER — Other Ambulatory Visit (HOSPITAL_COMMUNITY): Payer: Self-pay

## 2023-12-04 NOTE — Telephone Encounter (Signed)
 Pharmacy Patient Advocate Encounter   Received notification from CoverMyMeds that prior authorization for Modafinil  100MG  tablets is required/requested.   Insurance verification completed.   The patient is insured through [facility] Medicare Part D.   Per test claim: PA required; PA submitted to above mentioned insurance via CoverMyMeds Key/confirmation #/[facility] Status is pending

## 2023-12-05 ENCOUNTER — Ambulatory Visit (HOSPITAL_BASED_OUTPATIENT_CLINIC_OR_DEPARTMENT_OTHER): Payer: 59 | Admitting: Pulmonary Disease

## 2023-12-10 NOTE — Telephone Encounter (Signed)
 Pharmacy Patient Advocate Encounter  Received notification from [employer] Part D that Prior Authorization for Modafinil  100MG  tablets has been APPROVED from 12-04-2023 to 06-02-2024   PA #/Case ID/Reference #: [facility]

## 2023-12-10 NOTE — Telephone Encounter (Signed)
 Pt is aware of below message and voiced his understanding. Nothing further needed.

## 2024-01-03 ENCOUNTER — Other Ambulatory Visit: Payer: Self-pay | Admitting: *Deleted

## 2024-01-03 DIAGNOSIS — Z87891 Personal history of nicotine dependence: Secondary | ICD-10-CM

## 2024-01-03 DIAGNOSIS — Z122 Encounter for screening for malignant neoplasm of respiratory organs: Secondary | ICD-10-CM

## 2024-01-07 ENCOUNTER — Ambulatory Visit (HOSPITAL_COMMUNITY): Admission: RE | Admit: 2024-01-07 | Payer: 59 | Source: Ambulatory Visit

## 2024-02-12 ENCOUNTER — Other Ambulatory Visit: Payer: Self-pay | Admitting: Primary Care

## 2024-02-12 NOTE — Telephone Encounter (Signed)
**Note De-identified  [place] Obfuscation** Please advise 

## 2024-08-22 ENCOUNTER — Other Ambulatory Visit: Payer: Self-pay | Admitting: Primary Care

## 2024-08-22 NOTE — Telephone Encounter (Signed)
 Pt requesting refill of controlled medication, please advise thank you!

## 2024-08-22 NOTE — Telephone Encounter (Signed)
 Denied. We have not need patient over a year, needs visit

## 2024-09-26 ENCOUNTER — Other Ambulatory Visit: Payer: Self-pay | Admitting: Primary Care
